# Patient Record
Sex: Male | Born: 1970 | Race: White | Hispanic: No | Marital: Married | Smoking: Light tobacco smoker
Health system: Southern US, Community
[De-identification: ages and names within clinical notes are randomized; demographics above are authoritative.]

## PROBLEM LIST (undated history)

## (undated) DIAGNOSIS — M1990 Unspecified osteoarthritis, unspecified site: Secondary | ICD-10-CM

## (undated) DIAGNOSIS — R202 Paresthesia of skin: Secondary | ICD-10-CM

## (undated) DIAGNOSIS — Z8489 Family history of other specified conditions: Secondary | ICD-10-CM

## (undated) DIAGNOSIS — R200 Anesthesia of skin: Secondary | ICD-10-CM

## (undated) HISTORY — PX: WISDOM TOOTH EXTRACTION: SHX21

---

## 2012-09-10 ENCOUNTER — Ambulatory Visit (HOSPITAL_COMMUNITY)
Admission: RE | Admit: 2012-09-10 | Discharge: 2012-09-10 | Disposition: A | Discharge status: Home / Self Care | Source: Ambulatory Visit | Attending: Internal Medicine | Admitting: Internal Medicine

## 2012-09-10 ENCOUNTER — Other Ambulatory Visit (HOSPITAL_COMMUNITY): Payer: Self-pay | Admitting: Internal Medicine

## 2012-09-10 DIAGNOSIS — M545 Low back pain, unspecified: Secondary | ICD-10-CM | POA: Insufficient documentation

## 2012-09-10 DIAGNOSIS — M5137 Other intervertebral disc degeneration, lumbosacral region: Secondary | ICD-10-CM | POA: Insufficient documentation

## 2012-09-10 DIAGNOSIS — M5410 Radiculopathy, site unspecified: Secondary | ICD-10-CM

## 2012-09-10 DIAGNOSIS — M542 Cervicalgia: Secondary | ICD-10-CM | POA: Insufficient documentation

## 2012-09-10 DIAGNOSIS — M51379 Other intervertebral disc degeneration, lumbosacral region without mention of lumbar back pain or lower extremity pain: Secondary | ICD-10-CM | POA: Insufficient documentation

## 2013-10-01 ENCOUNTER — Encounter (HOSPITAL_COMMUNITY): Payer: Self-pay | Admitting: Pharmacy Technician

## 2013-10-10 ENCOUNTER — Other Ambulatory Visit: Payer: Self-pay | Admitting: Orthopedic Surgery

## 2013-10-15 ENCOUNTER — Encounter (HOSPITAL_COMMUNITY)
Admission: RE | Admit: 2013-10-15 | Discharge: 2013-10-15 | Disposition: A | Discharge status: Home / Self Care | Source: Ambulatory Visit | Attending: Orthopedic Surgery | Admitting: Orthopedic Surgery

## 2013-10-15 ENCOUNTER — Encounter (HOSPITAL_COMMUNITY): Payer: Self-pay

## 2013-10-15 HISTORY — DX: Unspecified osteoarthritis, unspecified site: M19.90

## 2013-10-15 HISTORY — DX: Paresthesia of skin: R20.2

## 2013-10-15 HISTORY — DX: Anesthesia of skin: R20.0

## 2013-10-15 HISTORY — DX: Family history of other specified conditions: Z84.89

## 2013-10-15 LAB — COMPREHENSIVE METABOLIC PANEL
ALBUMIN: 4.3 g/dL (ref 3.5–5.2)
ALK PHOS: 48 U/L (ref 39–117)
ALT: 20 U/L (ref 0–53)
AST: 24 U/L (ref 0–37)
BILIRUBIN TOTAL: 0.6 mg/dL (ref 0.3–1.2)
BUN: 19 mg/dL (ref 6–23)
CO2: 28 mEq/L (ref 19–32)
Calcium: 9.4 mg/dL (ref 8.4–10.5)
Chloride: 98 mEq/L (ref 96–112)
Creatinine, Ser: 1.11 mg/dL (ref 0.50–1.35)
GFR calc non Af Amer: 80 mL/min — ABNORMAL LOW (ref >90)
Glucose, Bld: 84 mg/dL (ref 70–99)
POTASSIUM: 3.9 meq/L (ref 3.7–5.3)
Sodium: 138 mEq/L (ref 137–147)
TOTAL PROTEIN: 7.4 g/dL (ref 6.0–8.3)

## 2013-10-15 LAB — CBC WITH DIFFERENTIAL/PLATELET
BASOS PCT: 0 % (ref 0–1)
Basophils Absolute: 0 10*3/uL (ref 0.0–0.1)
EOS ABS: 0.1 10*3/uL (ref 0.0–0.7)
Eosinophils Relative: 2 % (ref 0–5)
HEMATOCRIT: 40 % (ref 39.0–52.0)
HEMOGLOBIN: 14.6 g/dL (ref 13.0–17.0)
Lymphocytes Relative: 28 % (ref 12–46)
Lymphs Abs: 2.3 10*3/uL (ref 0.7–4.0)
MCH: 31.5 pg (ref 26.0–34.0)
MCHC: 36.5 g/dL — AB (ref 30.0–36.0)
MCV: 86.4 fL (ref 78.0–100.0)
MONO ABS: 0.4 10*3/uL (ref 0.1–1.0)
MONOS PCT: 5 % (ref 3–12)
Neutro Abs: 5.4 10*3/uL (ref 1.7–7.7)
Neutrophils Relative %: 65 % (ref 43–77)
Platelets: 191 10*3/uL (ref 150–400)
RBC: 4.63 MIL/uL (ref 4.22–5.81)
RDW: 12.3 % (ref 11.5–15.5)
WBC: 8.2 10*3/uL (ref 4.0–10.5)

## 2013-10-15 LAB — PROTIME-INR
INR: 1.05 (ref 0.00–1.49)
PROTHROMBIN TIME: 13.5 s (ref 11.6–15.2)

## 2013-10-15 LAB — URINALYSIS, ROUTINE W REFLEX MICROSCOPIC
Bilirubin Urine: NEGATIVE
Glucose, UA: NEGATIVE mg/dL
HGB URINE DIPSTICK: NEGATIVE
KETONES UR: NEGATIVE mg/dL
Leukocytes, UA: NEGATIVE
Nitrite: NEGATIVE
PH: 5.5 (ref 5.0–8.0)
Protein, ur: NEGATIVE mg/dL
SPECIFIC GRAVITY, URINE: 1.028 (ref 1.005–1.030)
Urobilinogen, UA: 0.2 mg/dL (ref 0.0–1.0)

## 2013-10-15 LAB — ABO/RH: ABO/RH(D): A POS

## 2013-10-15 LAB — APTT: aPTT: 27 seconds (ref 24–37)

## 2013-10-15 LAB — SURGICAL PCR SCREEN
MRSA, PCR: NEGATIVE
Staphylococcus aureus: NEGATIVE

## 2013-10-15 LAB — TYPE AND SCREEN
ABO/RH(D): A POS
ANTIBODY SCREEN: NEGATIVE

## 2013-10-15 MED ORDER — CHLORHEXIDINE GLUCONATE 4 % EX LIQD
1.0000 "application " | Freq: Once | CUTANEOUS | Status: DC
  Filled 2013-10-15: qty 15

## 2013-10-15 MED ORDER — POVIDONE-IODINE 7.5 % EX SOLN
Freq: Once | CUTANEOUS | Status: DC
  Filled 2013-10-15: qty 118

## 2013-10-15 MED ORDER — CEFAZOLIN SODIUM-DEXTROSE 2-3 GM-% IV SOLR
2.0000 g | INTRAVENOUS | Status: AC
  Administered 2013-10-16: 2 g via INTRAVENOUS
  Filled 2013-10-15: qty 50

## 2013-10-15 NOTE — Pre-Procedure Instructions (Signed)
Zannie CoveKane Baldonado  10/15/2013   Your procedure is scheduled on:  Thursday, October 16, 2013 at 2:35 PM  Report to [facility] (use Main Entrance "A'') at 11:30 AM.  Call this number if you have problems the morning of surgery: [phone]   Remember:   Do not eat food or drink liquids after midnight.   Take these medicines the morning of surgery with A SIP OF WATER: pregabalin (LYRICA) 50 MG capsule,  If needed:HYDROcodone-acetaminophen (NORCO) 10-325 MG per tablet for pain Stop taking Aspirin, vitamins and herbal medications. Do not take any NSAIDs ie: Ibuprofen, Advil, Naproxen or any medication containing Aspirin.  Do not wear jewelry, make-up or nail polish.  Do not wear lotions, powders, or perfumes.  Do not shave 48 hours prior to surgery.  Do not bring valuables to the hospital.  [facility] is not responsible for any belongings or valuables.               Contacts, dentures or bridgework may not be worn into surgery.  Leave suitcase in the car. After surgery it may be brought to your room.  For patients admitted to the hospital, discharge time is determined by your treatment team.               Patients discharged the day of surgery will not be allowed to drive home.  Name and phone number of your driver:  Special Instructions:  Special Instructions:Special Instructions: [facility] - Preparing [facility]  Before surgery, you can play an important role.  Because skin is not sterile, your skin needs to be as free of germs as possible.  You can reduce the number of germs on you skin by washing with CHG (chlorahexidine gluconate) soap before surgery.  CHG is an antiseptic cleaner which kills germs and bonds with the skin to continue killing germs even after washing.  Please DO NOT use if you have an allergy to CHG or antibacterial soaps.  If your skin becomes reddened/irritated stop using the CHG and inform your nurse when you arrive at Short Stay.  Do not shave (including  legs and underarms) for at least 48 hours prior to the first CHG shower.  You may shave your face.  Please follow these instructions carefully:   1.  Shower with CHG Soap the night before surgery and the morning of Surgery.  2.  If you choose to wash your hair, wash your hair first as usual with your normal shampoo.  3.  After you shampoo, rinse your hair and body thoroughly to remove the Shampoo.  4.  Use CHG as you would any other liquid soap.  You can apply chg directly  to the skin and wash gently with scrungie or a clean washcloth.  5.  Apply the CHG Soap to your body ONLY FROM THE NECK DOWN.  Do not use on open wounds or open sores.  Avoid contact with your eyes, ears, mouth and genitals (private parts).  Wash genitals (private parts) with your normal soap.  6.  Wash thoroughly, paying special attention to the area where your surgery will be performed.  7.  Thoroughly rinse your body with warm water from the neck down.  8.  DO NOT shower/wash with your normal soap after using and rinsing off the CHG Soap.  9.  Pat yourself dry with a clean towel.            10.  Wear clean pajamas.  11.  Place clean sheets on your bed the night of your first shower and do not sleep with pets.  Day of Surgery  Do not apply any lotions/deodorants the morning of surgery.  Please wear clean clothes to the hospital/surgery center.   Please read over the following fact sheets that you were given: Pain Booklet, Coughing and Deep Breathing, Blood Transfusion Information, MRSA Information and Surgical Site Infection Prevention

## 2013-10-16 ENCOUNTER — Inpatient Hospital Stay (HOSPITAL_COMMUNITY): Admitting: Anesthesiology

## 2013-10-16 ENCOUNTER — Inpatient Hospital Stay (HOSPITAL_COMMUNITY)

## 2013-10-16 ENCOUNTER — Encounter (HOSPITAL_COMMUNITY): Admitting: Anesthesiology

## 2013-10-16 ENCOUNTER — Encounter (HOSPITAL_COMMUNITY): Payer: Self-pay | Admitting: *Deleted

## 2013-10-16 ENCOUNTER — Inpatient Hospital Stay (HOSPITAL_COMMUNITY)
Admission: RE | Admit: 2013-10-16 | Discharge: 2013-10-17 | DRG: 030 | Disposition: A | Discharge status: Home / Self Care | Source: Ambulatory Visit | Attending: Orthopedic Surgery | Admitting: Orthopedic Surgery

## 2013-10-16 ENCOUNTER — Encounter (HOSPITAL_COMMUNITY)
Admission: RE | Disposition: A | Discharge status: Home / Self Care | Payer: Self-pay | Source: Ambulatory Visit | Attending: Orthopedic Surgery

## 2013-10-16 DIAGNOSIS — F17200 Nicotine dependence, unspecified, uncomplicated: Secondary | ICD-10-CM | POA: Diagnosis present

## 2013-10-16 DIAGNOSIS — M129 Arthropathy, unspecified: Secondary | ICD-10-CM | POA: Diagnosis present

## 2013-10-16 DIAGNOSIS — Z01810 Encounter for preprocedural cardiovascular examination: Secondary | ICD-10-CM

## 2013-10-16 DIAGNOSIS — M4802 Spinal stenosis, cervical region: Secondary | ICD-10-CM | POA: Diagnosis present

## 2013-10-16 DIAGNOSIS — M5410 Radiculopathy, site unspecified: Secondary | ICD-10-CM | POA: Diagnosis present

## 2013-10-16 DIAGNOSIS — Z79899 Other long term (current) drug therapy: Secondary | ICD-10-CM

## 2013-10-16 DIAGNOSIS — Z01818 Encounter for other preprocedural examination: Secondary | ICD-10-CM

## 2013-10-16 DIAGNOSIS — M5412 Radiculopathy, cervical region: Principal | ICD-10-CM | POA: Diagnosis present

## 2013-10-16 DIAGNOSIS — Z01812 Encounter for preprocedural laboratory examination: Secondary | ICD-10-CM

## 2013-10-16 HISTORY — PX: ANTERIOR CERVICAL DECOMP/DISCECTOMY FUSION: SHX1161

## 2013-10-16 SURGERY — ANTERIOR CERVICAL DECOMPRESSION/DISCECTOMY FUSION 2 LEVELS
Anesthesia: General

## 2013-10-16 MED ORDER — CEFAZOLIN SODIUM 1-5 GM-% IV SOLN
1.0000 g | Freq: Three times a day (TID) | INTRAVENOUS | Status: AC
  Administered 2013-10-16 – 2013-10-17 (×2): 1 g via INTRAVENOUS
  Filled 2013-10-16 (×2): qty 50

## 2013-10-16 MED ORDER — PROPOFOL 10 MG/ML IV BOLUS
INTRAVENOUS | Status: DC | PRN
  Administered 2013-10-16: 200 mg via INTRAVENOUS

## 2013-10-16 MED ORDER — LACTATED RINGERS IV SOLN
INTRAVENOUS | Status: DC
  Administered 2013-10-16: 10:00:00 via INTRAVENOUS

## 2013-10-16 MED ORDER — EPHEDRINE SULFATE 50 MG/ML IJ SOLN
INTRAMUSCULAR | Status: AC
  Filled 2013-10-16: qty 1

## 2013-10-16 MED ORDER — BUPIVACAINE-EPINEPHRINE 0.25% -1:200000 IJ SOLN
INTRAMUSCULAR | Status: DC | PRN
Start: 2013-10-16 — End: 2013-10-16
  Administered 2013-10-16: 3 mL

## 2013-10-16 MED ORDER — SENNOSIDES-DOCUSATE SODIUM 8.6-50 MG PO TABS
1.0000 | ORAL_TABLET | Freq: Every evening | ORAL | Status: DC | PRN
  Filled 2013-10-16: qty 1

## 2013-10-16 MED ORDER — PANTOPRAZOLE SODIUM 40 MG IV SOLR
40.0000 mg | Freq: Every day | INTRAVENOUS | Status: DC
  Administered 2013-10-16: 40 mg via INTRAVENOUS
  Filled 2013-10-16 (×2): qty 40

## 2013-10-16 MED ORDER — DIAZEPAM 5 MG PO TABS
5.0000 mg | ORAL_TABLET | Freq: Four times a day (QID) | ORAL | Status: DC | PRN
Start: 2013-10-16 — End: 2013-10-17
  Administered 2013-10-16 – 2013-10-17 (×3): 5 mg via ORAL
  Filled 2013-10-16 (×3): qty 1

## 2013-10-16 MED ORDER — ONDANSETRON HCL 4 MG/2ML IJ SOLN
INTRAMUSCULAR | Status: AC
  Filled 2013-10-16: qty 2

## 2013-10-16 MED ORDER — GLYCOPYRROLATE 0.2 MG/ML IJ SOLN
INTRAMUSCULAR | Status: AC
  Filled 2013-10-16: qty 1

## 2013-10-16 MED ORDER — ROCURONIUM BROMIDE 100 MG/10ML IV SOLN
INTRAVENOUS | Status: DC | PRN
  Administered 2013-10-16: 50 mg via INTRAVENOUS
  Administered 2013-10-16: 10 mg via INTRAVENOUS
  Administered 2013-10-16: 20 mg via INTRAVENOUS

## 2013-10-16 MED ORDER — MIDAZOLAM HCL 5 MG/5ML IJ SOLN
INTRAMUSCULAR | Status: DC | PRN
  Administered 2013-10-16: 2 mg via INTRAVENOUS

## 2013-10-16 MED ORDER — ROCURONIUM BROMIDE 50 MG/5ML IV SOLN
INTRAVENOUS | Status: AC
  Filled 2013-10-16: qty 1

## 2013-10-16 MED ORDER — PROMETHAZINE HCL 25 MG/ML IJ SOLN: 6.2500 mg | INTRAMUSCULAR | Status: DC | PRN

## 2013-10-16 MED ORDER — BUPIVACAINE-EPINEPHRINE PF 0.25-1:200000 % IJ SOLN
INTRAMUSCULAR | Status: AC
  Filled 2013-10-16: qty 30

## 2013-10-16 MED ORDER — PROPOFOL 10 MG/ML IV BOLUS
INTRAVENOUS | Status: AC
  Filled 2013-10-16: qty 20

## 2013-10-16 MED ORDER — BISACODYL 5 MG PO TBEC
5.0000 mg | DELAYED_RELEASE_TABLET | Freq: Every day | ORAL | Status: DC | PRN
Start: 2013-10-16 — End: 2013-10-17
  Filled 2013-10-16: qty 1

## 2013-10-16 MED ORDER — MENTHOL 3 MG MT LOZG
1.0000 | LOZENGE | OROMUCOSAL | Status: DC | PRN
  Administered 2013-10-17: 3 mg via ORAL
  Filled 2013-10-16: qty 9

## 2013-10-16 MED ORDER — DOCUSATE SODIUM 100 MG PO CAPS
100.0000 mg | ORAL_CAPSULE | Freq: Two times a day (BID) | ORAL | Status: DC
  Administered 2013-10-16: 100 mg via ORAL
  Filled 2013-10-16 (×3): qty 1

## 2013-10-16 MED ORDER — MORPHINE SULFATE 2 MG/ML IJ SOLN
1.0000 mg | INTRAMUSCULAR | Status: DC | PRN
  Administered 2013-10-16 (×2): 4 mg via INTRAVENOUS
  Filled 2013-10-16 (×2): qty 2

## 2013-10-16 MED ORDER — ALUM & MAG HYDROXIDE-SIMETH 200-200-20 MG/5ML PO SUSP: 30.0000 mL | Freq: Four times a day (QID) | ORAL | Status: DC | PRN

## 2013-10-16 MED ORDER — GLYCOPYRROLATE 0.2 MG/ML IJ SOLN
INTRAMUSCULAR | Status: AC
  Filled 2013-10-16: qty 3

## 2013-10-16 MED ORDER — FLEET ENEMA 7-19 GM/118ML RE ENEM: 1.0000 | ENEMA | Freq: Once | RECTAL | Status: AC | PRN

## 2013-10-16 MED ORDER — ONDANSETRON HCL 4 MG/2ML IJ SOLN
INTRAMUSCULAR | Status: DC | PRN
  Administered 2013-10-16: 4 mg via INTRAVENOUS

## 2013-10-16 MED ORDER — OXYCODONE HCL 5 MG PO TABS
5.0000 mg | ORAL_TABLET | Freq: Once | ORAL | Status: AC | PRN
  Administered 2013-10-16: 5 mg via ORAL

## 2013-10-16 MED ORDER — ACETAMINOPHEN 650 MG RE SUPP: 650.0000 mg | RECTAL | Status: DC | PRN

## 2013-10-16 MED ORDER — STERILE WATER FOR INJECTION IJ SOLN
INTRAMUSCULAR | Status: AC
  Filled 2013-10-16: qty 10

## 2013-10-16 MED ORDER — LIDOCAINE HCL (CARDIAC) 20 MG/ML IV SOLN
INTRAVENOUS | Status: DC | PRN
  Administered 2013-10-16: 100 mg via INTRAVENOUS

## 2013-10-16 MED ORDER — SODIUM CHLORIDE 0.9 % IJ SOLN
3.0000 mL | Freq: Two times a day (BID) | INTRAMUSCULAR | Status: DC
  Administered 2013-10-16: 3 mL via INTRAVENOUS

## 2013-10-16 MED ORDER — FENTANYL CITRATE 0.05 MG/ML IJ SOLN
INTRAMUSCULAR | Status: AC
  Filled 2013-10-16: qty 5

## 2013-10-16 MED ORDER — HYDROMORPHONE HCL PF 1 MG/ML IJ SOLN
INTRAMUSCULAR | Status: AC
  Filled 2013-10-16: qty 2

## 2013-10-16 MED ORDER — SODIUM CHLORIDE 0.9 % IV SOLN: 250.0000 mL | INTRAVENOUS | Status: DC

## 2013-10-16 MED ORDER — PREGABALIN 50 MG PO CAPS
50.0000 mg | ORAL_CAPSULE | Freq: Two times a day (BID) | ORAL | Status: DC
  Administered 2013-10-16: 50 mg via ORAL
  Filled 2013-10-16: qty 1

## 2013-10-16 MED ORDER — ACETAMINOPHEN 325 MG PO TABS: 650.0000 mg | ORAL_TABLET | ORAL | Status: DC | PRN

## 2013-10-16 MED ORDER — OXYCODONE HCL 5 MG/5ML PO SOLN: 5.0000 mg | Freq: Once | ORAL | Status: AC | PRN

## 2013-10-16 MED ORDER — PHENOL 1.4 % MT LIQD
1.0000 | OROMUCOSAL | Status: DC | PRN
  Administered 2013-10-17: 1 via OROMUCOSAL
  Filled 2013-10-16: qty 177

## 2013-10-16 MED ORDER — FENTANYL CITRATE 0.05 MG/ML IJ SOLN
INTRAMUSCULAR | Status: DC | PRN
  Administered 2013-10-16: 100 ug via INTRAVENOUS
  Administered 2013-10-16 (×3): 50 ug via INTRAVENOUS

## 2013-10-16 MED ORDER — MIDAZOLAM HCL 2 MG/2ML IJ SOLN: 0.5000 mg | Freq: Once | INTRAMUSCULAR | Status: DC | PRN

## 2013-10-16 MED ORDER — THROMBIN 20000 UNITS EX KIT
PACK | CUTANEOUS | Status: AC
  Filled 2013-10-16: qty 1

## 2013-10-16 MED ORDER — THROMBIN 20000 UNITS EX SOLR
CUTANEOUS | Status: DC | PRN
  Administered 2013-10-16: 14:00:00 via TOPICAL

## 2013-10-16 MED ORDER — ARTIFICIAL TEARS OP OINT
TOPICAL_OINTMENT | OPHTHALMIC | Status: DC | PRN
  Administered 2013-10-16: 1 via OPHTHALMIC

## 2013-10-16 MED ORDER — NEOSTIGMINE METHYLSULFATE 1 MG/ML IJ SOLN
INTRAMUSCULAR | Status: DC | PRN
  Administered 2013-10-16: 4 mg via INTRAVENOUS

## 2013-10-16 MED ORDER — OXYCODONE HCL 5 MG PO TABS
ORAL_TABLET | ORAL | Status: AC
  Filled 2013-10-16: qty 1

## 2013-10-16 MED ORDER — MIDAZOLAM HCL 2 MG/2ML IJ SOLN
INTRAMUSCULAR | Status: AC
  Filled 2013-10-16: qty 2

## 2013-10-16 MED ORDER — SODIUM CHLORIDE 0.9 % IJ SOLN: 3.0000 mL | INTRAMUSCULAR | Status: DC | PRN

## 2013-10-16 MED ORDER — FENTANYL CITRATE 0.05 MG/ML IJ SOLN
INTRAMUSCULAR | Status: AC
Start: 2013-10-16 — End: 2013-10-16
  Filled 2013-10-16: qty 5

## 2013-10-16 MED ORDER — GLYCOPYRROLATE 0.2 MG/ML IJ SOLN
INTRAMUSCULAR | Status: DC | PRN
  Administered 2013-10-16: 0.6 mg via INTRAVENOUS
  Administered 2013-10-16: 0.2 mg via INTRAVENOUS

## 2013-10-16 MED ORDER — LACTATED RINGERS IV SOLN
INTRAVENOUS | Status: DC | PRN
  Administered 2013-10-16 (×3): via INTRAVENOUS

## 2013-10-16 MED ORDER — HYDROMORPHONE HCL PF 1 MG/ML IJ SOLN
0.2500 mg | INTRAMUSCULAR | Status: DC | PRN
  Administered 2013-10-16 (×4): 0.5 mg via INTRAVENOUS

## 2013-10-16 MED ORDER — THROMBIN 20000 UNITS EX SOLR
CUTANEOUS | Status: AC
  Filled 2013-10-16: qty 20000

## 2013-10-16 MED ORDER — MEPERIDINE HCL 25 MG/ML IJ SOLN: 6.2500 mg | INTRAMUSCULAR | Status: DC | PRN

## 2013-10-16 MED ORDER — EPHEDRINE SULFATE 50 MG/ML IJ SOLN
INTRAMUSCULAR | Status: DC | PRN
  Administered 2013-10-16: 10 mg via INTRAVENOUS
  Administered 2013-10-16: 15 mg via INTRAVENOUS

## 2013-10-16 MED ORDER — ONDANSETRON HCL 4 MG/2ML IJ SOLN: 4.0000 mg | INTRAMUSCULAR | Status: DC | PRN

## 2013-10-16 MED ORDER — OXYCODONE-ACETAMINOPHEN 5-325 MG PO TABS
1.0000 | ORAL_TABLET | ORAL | Status: DC | PRN
  Administered 2013-10-16 – 2013-10-17 (×3): 2 via ORAL
  Filled 2013-10-16 (×3): qty 2

## 2013-10-16 MED ORDER — ALBUMIN HUMAN 5 % IV SOLN
INTRAVENOUS | Status: DC | PRN
  Administered 2013-10-16: 14:00:00 via INTRAVENOUS

## 2013-10-16 SURGICAL SUPPLY — 70 items
BENZOIN TINCTURE PRP APPL 2/3 (GAUZE/BANDAGES/DRESSINGS) ×2 IMPLANT
BIT DRILL NEURO 2X3.1 SFT TUCH (MISCELLANEOUS) ×1 IMPLANT
BIT DRILL SKYLINE 12MM (BIT) ×1 IMPLANT
BLADE SURG 15 STRL LF DISP TIS (BLADE) ×1 IMPLANT
BLADE SURG 15 STRL SS (BLADE) ×1
BLADE SURG ROTATE 9660 (MISCELLANEOUS) ×2 IMPLANT
BUR MATCHSTICK NEURO 3.0 LAGG (BURR) ×2 IMPLANT
CARTRIDGE OIL MAESTRO DRILL (MISCELLANEOUS) ×1 IMPLANT
CERVICAL PARALLEL MED 7MM (Bone Implant) ×4 IMPLANT
CLSR STERI-STRIP ANTIMIC 1/2X4 (GAUZE/BANDAGES/DRESSINGS) ×2 IMPLANT
COLLAR CERV LO CONTOUR FIRM DE (SOFTGOODS) IMPLANT
CORDS BIPOLAR (ELECTRODE) ×2 IMPLANT
COVER SURGICAL LIGHT HANDLE (MISCELLANEOUS) ×2 IMPLANT
CRADLE DONUT ADULT HEAD (MISCELLANEOUS) ×2 IMPLANT
DIFFUSER DRILL AIR PNEUMATIC (MISCELLANEOUS) ×2 IMPLANT
DRAIN JACKSON RD 7FR 3/32 (WOUND CARE) IMPLANT
DRAPE C-ARM 42X72 X-RAY (DRAPES) ×2 IMPLANT
DRAPE POUCH INSTRU U-SHP 10X18 (DRAPES) ×2 IMPLANT
DRAPE SURG 17X23 STRL (DRAPES) ×6 IMPLANT
DRILL BIT SKYLINE 12MM (BIT) ×1
DRILL NEURO 2X3.1 SOFT TOUCH (MISCELLANEOUS) ×2
DURAPREP 26ML APPLICATOR (WOUND CARE) ×2 IMPLANT
ELECT COATED BLADE 2.86 ST (ELECTRODE) ×2 IMPLANT
ELECT REM PT RETURN 9FT ADLT (ELECTROSURGICAL) ×2
ELECTRODE REM PT RTRN 9FT ADLT (ELECTROSURGICAL) ×1 IMPLANT
EVACUATOR SILICONE 100CC (DRAIN) IMPLANT
GAUZE SPONGE 4X4 16PLY XRAY LF (GAUZE/BANDAGES/DRESSINGS) ×2 IMPLANT
GLOVE BIO SURGEON STRL SZ7 (GLOVE) ×2 IMPLANT
GLOVE BIO SURGEON STRL SZ8 (GLOVE) ×2 IMPLANT
GLOVE BIOGEL PI IND STRL 7.5 (GLOVE) ×2 IMPLANT
GLOVE BIOGEL PI IND STRL 8 (GLOVE) ×1 IMPLANT
GLOVE BIOGEL PI INDICATOR 7.5 (GLOVE) ×2
GLOVE BIOGEL PI INDICATOR 8 (GLOVE) ×1
GOWN STRL NON-REIN LRG LVL3 (GOWN DISPOSABLE) ×2 IMPLANT
GOWN STRL REIN XL XLG (GOWN DISPOSABLE) ×2 IMPLANT
IV CATH 14GX2 1/4 (CATHETERS) ×2 IMPLANT
KIT BASIN OR (CUSTOM PROCEDURE TRAY) ×2 IMPLANT
KIT ROOM TURNOVER OR (KITS) ×2 IMPLANT
MANIFOLD NEPTUNE II (INSTRUMENTS) ×2 IMPLANT
NEEDLE 27GAX1X1/2 (NEEDLE) ×2 IMPLANT
NEEDLE SPNL 20GX3.5 QUINCKE YW (NEEDLE) ×2 IMPLANT
NS IRRIG 1000ML POUR BTL (IV SOLUTION) ×2 IMPLANT
OIL CARTRIDGE MAESTRO DRILL (MISCELLANEOUS) ×2
PACK ORTHO CERVICAL (CUSTOM PROCEDURE TRAY) ×2 IMPLANT
PAD ARMBOARD 7.5X6 YLW CONV (MISCELLANEOUS) ×4 IMPLANT
PATTIES SURGICAL .5 X.5 (GAUZE/BANDAGES/DRESSINGS) IMPLANT
PATTIES SURGICAL .5 X1 (DISPOSABLE) IMPLANT
PIN DISTRACTION 14 (PIN) ×4 IMPLANT
PLATE SKYLINE 2 LEVEL 34MM (Plate) ×2 IMPLANT
PUTTY BONE DBX 2.5 MIS (Bone Implant) ×2 IMPLANT
SCREW VAR SELF TAP SKYLINE 14M (Screw) ×12 IMPLANT
SPONGE GAUZE 4X4 12PLY (GAUZE/BANDAGES/DRESSINGS) ×2 IMPLANT
SPONGE GAUZE 4X4 12PLY STER LF (GAUZE/BANDAGES/DRESSINGS) ×2 IMPLANT
SPONGE INTESTINAL PEANUT (DISPOSABLE) ×2 IMPLANT
SPONGE SURGIFOAM ABS GEL 100 (HEMOSTASIS) ×2 IMPLANT
STRIP CLOSURE SKIN 1/2X4 (GAUZE/BANDAGES/DRESSINGS) ×2 IMPLANT
SURGIFLO TRUKIT (HEMOSTASIS) IMPLANT
SUT MNCRL AB 4-0 PS2 18 (SUTURE) ×2 IMPLANT
SUT SILK 4 0 (SUTURE)
SUT SILK 4-0 18XBRD TIE 12 (SUTURE) IMPLANT
SUT VIC AB 2-0 CT2 18 VCP726D (SUTURE) ×2 IMPLANT
SYR BULB IRRIGATION 50ML (SYRINGE) ×2 IMPLANT
SYR CONTROL 10ML LL (SYRINGE) ×4 IMPLANT
TAPE CLOTH 4X10 WHT NS (GAUZE/BANDAGES/DRESSINGS) ×2 IMPLANT
TAPE CLOTH SURG 4X10 WHT LF (GAUZE/BANDAGES/DRESSINGS) ×2 IMPLANT
TAPE UMBILICAL COTTON 1/8X30 (MISCELLANEOUS) ×2 IMPLANT
TOWEL OR 17X24 6PK STRL BLUE (TOWEL DISPOSABLE) ×2 IMPLANT
TOWEL OR 17X26 10 PK STRL BLUE (TOWEL DISPOSABLE) ×2 IMPLANT
WATER STERILE IRR 1000ML POUR (IV SOLUTION) ×2 IMPLANT
YANKAUER SUCT BULB TIP NO VENT (SUCTIONS) ×2 IMPLANT

## 2013-10-16 NOTE — Progress Notes (Signed)
Orthopedic Tech Progress Note Patient Details:  Jeffery Marshall 1971/04/02 [id number]  Ortho Devices Type of Ortho Device: [place] cervical collar Ortho Device/Splint Interventions: Jeffery DoffingOrdered   Jeffery Marshall 10/16/2013, 5:50 PM

## 2013-10-16 NOTE — Anesthesia Postprocedure Evaluation (Signed)
Anesthesia Post Note  Patient: Jeffery Marshall  Procedure(s) Performed: Procedure(s) (LRB): ANTERIOR CERVICAL DECOMPRESSION/DISCECTOMY FUSION 2 LEVELS (N/A)  Anesthesia type: General  Patient location: PACU  Post pain: Pain level controlled and Adequate analgesia  Post assessment: Post-op Vital signs reviewed, Patient's Cardiovascular Status Stable, Respiratory Function Stable, Patent Airway and Pain level controlled  Last Vitals:  Filed Vitals:   10/16/13 1630  BP:   Pulse: 97  Temp:   Resp: 14    Post vital signs: Reviewed and stable  Level of consciousness: awake, alert  and oriented  Complications: No apparent anesthesia complications

## 2013-10-16 NOTE — Transfer of Care (Signed)
Immediate Anesthesia Transfer of Care Note  Patient: Jeffery Marshall  Procedure(s) Performed: Procedure(s) with comments: ANTERIOR CERVICAL DECOMPRESSION/DISCECTOMY FUSION 2 LEVELS (N/A) - Anterior cervical decompression fusion, cervical 5-6, cervical 6-7 with instrumentation and allograft  Patient Location: PACU  Anesthesia Type:General  Level of Consciousness: awake, alert  and oriented  Airway & Oxygen Therapy: Patient Spontanous Breathing and Patient connected to nasal cannula oxygen  Post-op Assessment: Report given to PACU RN and Post -op Vital signs reviewed and stable  Post vital signs: Reviewed and stable  Complications: No apparent anesthesia complications

## 2013-10-16 NOTE — H&P (Signed)
PREOPERATIVE H&P  Chief Complaint: left arm pain  HPI: Jeffery Marshall is a 43 y.o. male who presents with ongoing pain in the left arm. 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 EMG was also diagnostic for radiculopathy. Patient failed multiple forms of conservative care and did wish to proceed with surgical intervention.  Past Medical History  Diagnosis Date  . Family history of anesthesia complication     Hx; of uncle with problem waking up in 1950''s or 60's  . Numbness and tingling in left arm     Hx: of   . Arthritis    Past Surgical History  Procedure Laterality Date  . Wisdom tooth extraction      Hx; of   History   Social History  . Marital Status: Married    Spouse Name: N/A    Number of Children: N/A  . Years of Education: N/A   Social History Main Topics  . Smoking status: Light Tobacco Smoker    Types: Cigars  . Smokeless tobacco: Former NeurosurgeonUser    Types: Snuff  . Alcohol Use: Yes     Comment: occasional   . Drug Use: No  . Sexual Activity: Not on file   Other Topics Concern  . Not on file   Social History Narrative  . No narrative on file   Family History  Problem Relation Age of Onset  . Diabetes Father   . Hypertension Father   . Cancer Sister    No Known Allergies Prior to Admission medications   Medication Sig Start Date End Date Taking? Authorizing Provider  HYDROcodone-acetaminophen (NORCO) 10-325 MG per tablet Take 1 tablet by mouth every 6 (six) hours as needed for moderate pain.   Yes Historical Provider, MD  pregabalin (LYRICA) 50 MG capsule Take 50 mg by mouth 2 (two) times daily.   Yes Historical Provider, MD     All other systems have been reviewed and were otherwise negative with the exception of those mentioned in the HPI and as above.  Physical Exam: There were no vitals filed for this visit.  General: Alert, no acute distress Cardiovascular: No pedal edema Respiratory: No cyanosis, no use of  accessory musculature Skin: No lesions in the area of chief complaint Neurologic: Sensation intact distally Psychiatric: Patient is competent for consent with normal mood and affect Lymphatic: No axillary or cervical lymphadenopathy  MUSCULOSKELETAL: + spurling's sign on left  Assessment/Plan: Left arm pain Plan for Procedure(s): ANTERIOR CERVICAL DECOMPRESSION/DISCECTOMY FUSION 2 LEVELS   Emilee HeroUMONSKI,Roselynne Lortz LEONARD, MD 10/16/2013 7:18 AM

## 2013-10-16 NOTE — Anesthesia Procedure Notes (Signed)
Procedure Name: Intubation Date/Time: 10/16/2013 1:01 PM Performed by: Fransisca KaufmannMEYER, Gurfateh Mcclain E Pre-anesthesia Checklist: Patient identified, Emergency Drugs available, Suction available, Patient being monitored and Timeout performed Patient Re-evaluated:Patient Re-evaluated prior to inductionOxygen Delivery Method: Circle system utilized Preoxygenation: Pre-oxygenation with 100% oxygen Intubation Type: IV induction Ventilation: Mask ventilation without difficulty Laryngoscope Size: Miller and 3 Grade View: Grade I Tube type: Oral Tube size: 7.5 mm Number of attempts: 1 Airway Equipment and Method: Stylet Placement Confirmation: ETT inserted through vocal cords under direct vision,  positive ETCO2 and breath sounds checked- equal and bilateral Secured at: 23 cm Tube secured with: Tape Dental Injury: Teeth and Oropharynx as per pre-operative assessment

## 2013-10-16 NOTE — Preoperative (Signed)
Beta Blockers   Reason not to administer Beta Blockers:Not Applicable 

## 2013-10-16 NOTE — Anesthesia Preprocedure Evaluation (Addendum)
Anesthesia Evaluation  Patient identified by MRN, date of birth, ID band Patient awake    Reviewed: Allergy & Precautions, H&P , NPO status , Patient's Chart, lab work & pertinent test results  History of Anesthesia Complications Negative for: history of anesthetic complications  Airway Mallampati: II  Neck ROM: Full    Dental  (+) Dental Advisory Given, Chipped   Pulmonary Current Smoker,  breath sounds clear to auscultation  Pulmonary exam normal       Cardiovascular negative cardio ROS  Rhythm:Regular Rate:Normal     Neuro/Psych Neck pain    GI/Hepatic negative GI ROS, Neg liver ROS,   Endo/Other  negative endocrine ROS  Renal/GU negative Renal ROS     Musculoskeletal   Abdominal   Peds  Hematology negative hematology ROS (+)   Anesthesia Other Findings   Reproductive/Obstetrics                          Anesthesia Physical Anesthesia Plan  ASA: I  Anesthesia Plan: General   Post-op Pain Management:    Induction: Intravenous  Airway Management Planned: Oral ETT  Additional Equipment:   Intra-op Plan:   Post-operative Plan: Extubation in OR  Informed Consent: I have reviewed the patients History and Physical, chart, labs and discussed the procedure including the risks, benefits and alternatives for the proposed anesthesia with the patient or authorized representative who has indicated his/her understanding and acceptance.   Dental advisory given  Plan Discussed with: CRNA and Surgeon  Anesthesia Plan Comments: (Plan routine monitors, GETA)        Anesthesia Quick Evaluation

## 2013-10-17 ENCOUNTER — Encounter (HOSPITAL_COMMUNITY): Payer: Self-pay | Admitting: Orthopedic Surgery

## 2013-10-17 NOTE — Progress Notes (Signed)
Pt. discharged home accompanied by spouse. Prescriptions and discharge instructions given with verbalization of understanding. Incision site on neck with no s/s of infection - no swelling, redness, bleeding, and/or drainage noted. Soft collar intact. Pain med given just before leaving. Opportunity given to ask questions but no question asked. Pt. transported out of this unit in wheelchair by the nurse tech. 

## 2013-10-17 NOTE — Plan of Care (Signed)
Problem: Consults Goal: Diagnosis - Spinal Surgery Outcome: Completed/Met Date Met:  10/17/13 Cervical Spine Fusion

## 2013-10-17 NOTE — Progress Notes (Signed)
Utilization review completed.  

## 2013-10-17 NOTE — Progress Notes (Signed)
Patient reports resolution of left arm pain. Minimal neck discomfort. No swallowing difficulties.  BP 109/59  Pulse 67  Temp(Src) 98 F (36.7 C) (Oral)  Resp 16  SpO2 97%  NVI Dressing in place Collar well applied  S/p 5-7 acdf  - philly collar for showering - no lifting > 10 pounds - f/u 2 weeks

## 2013-10-17 NOTE — Op Note (Signed)
NAMEHAMMOND, OBEIRNE NO.:  [id number]  MEDICAL RECORD NO.:  [id number]  LOCATION:  3C06C                        FACILITY:  [facility]  PHYSICIAN:  Estill Bamberg, MD      DATE OF BIRTH:  Dec 16, 1970  DATE OF PROCEDURE:  10/16/2013                              OPERATIVE REPORT   PREOPERATIVE DIAGNOSES: 1. Left-sided C6 radiculopathy. 2. Left-sided C7 radiculopathy. 3. 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  POSTOPERATIVE DIAGNOSES: 1. Left-sided C6 radiculopathy. 2. Left-sided C7 radiculopathy. 3. 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  PROCEDURES: 1. Anterior cervical decompression and fusion, C5-6, C6-7. 2. Placement of anterior instrumentation, C5-C7. 3. Insertion of interbody device x2 (7-mm parallel medium Titan     interbody spacers). 4. Use of morselized allograft. 5. Intraoperative use of fluoroscopy.  SURGEON:  Estill Bamberg, MD  ASSISTANT:  Jason Coop, PA-C  ANESTHESIA:  General endotracheal anesthesia.  COMPLICATIONS:  None.  DISPOSITION:  Stable.  ESTIMATED BLOOD LOSS:  Minimal.  INDICATIONS FOR PROCEDURE:  Briefly, Mr. Memmott is a very pleasant 43- year-old male, who did initially present to me on August 27, 2013, with significant pain in the left arm.  The patient did have an EMG which was diagnostic for left C6 and C7 radiculopathy.  His MRI did also reveal neural foraminal narrowing at the C3-4 level.  I therefore did arrange him having a left C4 nerve block.  He states that the nerve block did not help his symptoms at all.  I therefore did feel confident and explained to the patient that I did feel his left arm pain was secondary to C6 and C7 radiculopathy.  He did fail multiple forms of conservative care, did continue to have pain.  We therefore did discuss proceeding with an anterior cervical decompression and fusion at the C5- 6 and C6-7 levels.  The patient did fully understand the risks  and limitations of the procedure as outlined in my preoperative note.  OPERATIVE DETAILS:  On October 16, 2013, the patient was brought to surgery and general endotracheal anesthesia was administered.  The patient was placed supine on a hospital bed.  Antibiotics were given. The neck was prepped and draped in usual sterile fashion.  All bony prominences were padded.  Time-out procedure was performed.  I then made a left-sided transverse incision overlying the C6 vertebral body.  The platysma was sharply incised.  The plane between the sternocleidomastoid muscle and the strap muscles was identified and explored.  The anterior cervical spine was readily noted.  I then obtained a lateral intraoperative fluoroscopic view to confirm the appropriate operative levels.  I then subperiosteally exposed the vertebral bodies of C5, C6, and C7.  A Shadow-Line retractor was placed and centered over the C6-7 intervertebral space.  Caspar pins were placed into the C6 and C7 vertebral bodies and distraction was applied.  I then went forward with a diskectomy using a 15-blade knife followed by a series of curettes and pituitary rongeurs and Kerrison punches.  The posterior longitudinal ligament was identified and entered using a nerve hook.  I then used a #1 followed by #2 Kerrison to perform a thorough central  and bilateral neural foraminal decompression.  The endplates were then prepared.  I then placed a series of trials and I did feel the appropriate size interbody spacer with DBX mix which was tamped into position in the usual fashion under distraction.  A Caspar pin from the C7 vertebral body was removed and the bone wax was placed in its place.  I then placed a Caspar pin into the C5 vertebral body.  Distraction was applied across the C5-6 interspace.  A diskectomy was performed in the manner previously described.  Once again, I did confirm a bilateral neural foraminal decompression.  The  endplates were again prepared.  I again placed a series of trials and I did select the appropriate size interbody spacer which was packed with DBX mix and tamped into position in the usual fashion.  The Caspar pins were removed.  The bone wax was placed in their place.  I then selected the appropriate sized anterior cervical plate, which was placed over the anterior cervical spine.  14- mm variable angle screws were placed, two in each vertebral body at C5, C6, and C7 for a total of six vertebral body screws.  I was very pleased with the press fit of the screws.  A Cam locking mechanism was utilized. The wound was then copiously irrigated.  I was very pleased with the AP and lateral fluoroscopic images.  The platysma was then closed using 2-0 Vicryl.  The skin was then closed using 4-0 Monocryl.  Benzoin and Steri- Strips were applied followed by sterile dressing.  All instrument counts were correct at the termination of the procedure.  I did explore the wound for any undue bleeding prior to closure, and none was encountered.  Of note, Jason CoopKayla McKenzie, was my assistant throughout the entirety of the procedure, and did aid in essential retraction and suctioning needed throughout the surgery.     Estill BambergMark Ulysees Robarts, MD     MD/MEDQ  D:  10/16/2013  T:  10/17/2013  Job:  [id number]  cc:   Kingsley Callanderoy O. Ouida SillsFagan, MD Dr. Romero BellingWesley Ibazebo

## 2013-10-29 NOTE — Discharge Summary (Signed)
Patient ID: Jeffery Marshall MRN: [id number] DOB/AGE: 11-13-1970 43 y.o.  Admit date: 10/16/2013 Discharge date: 10/17/2013  Admission Diagnoses:  Active Problems:   Radiculopathy   Discharge Diagnoses:  Same  Past Medical History  Diagnosis Date  . Family history of anesthesia complication     Hx; of uncle with problem waking up in 1950''s or 60's  . Numbness and tingling in left arm     Hx: of   . Arthritis     Surgeries: Procedure(s): ANTERIOR CERVICAL DECOMPRESSION/DISCECTOMY FUSION 2 LEVELS C5-7 on 10/16/2013   Discharged Condition: Improved  Hospital Course: Jeffery Marshall is an 43 y.o male who was admitted 10/16/2013 for operative treatment of left radiculopathy. Patient has severe unremitting pain that affects sleep, daily activities, and work/hobbies. After pre-op clearance the patient was taken to the operating room on 10/16/2013 and underwent  Procedure(s): ANTERIOR CERVICAL DECOMPRESSION/DISCECTOMY FUSION 2 LEVELS C5-7.    Patient was given perioperative antibiotics:  Anti-infectives   Start     Dose/Rate Route Frequency Ordered Stop   10/16/13 2000  ceFAZolin (ANCEF) IVPB 1 g/50 mL premix     1 g 100 mL/hr over 30 Minutes Intravenous Every 8 hours 10/16/13 1742 10/17/13 0359   10/16/13 0600  ceFAZolin (ANCEF) IVPB 2 g/50 mL premix     2 g 100 mL/hr over 30 Minutes Intravenous On call to O.R. 10/15/13 1437 10/16/13 1305       Patient was given sequential compression devices, early ambulation to prevent DVT.  Patient benefited maximally from hospital stay and there were no complications.    Recent vital signs: BP 118/82  Pulse 80  Temp(Src) 98.7 F (37.1 C) (Oral)  Resp 18  SpO2 100%   Discharge Medications:     Medication List    STOP taking these medications       HYDROcodone-acetaminophen 10-325 MG per tablet  Commonly known as:  NORCO      TAKE these medications       pregabalin 50 MG capsule  Commonly known as:  LYRICA  Take 50 mg by mouth 2  (two) times daily.        Diagnostic Studies: Dg Chest 2 View  10/15/2013   CLINICAL DATA:  Preop cervical spine surgery  EXAM: CHEST  2 VIEW  COMPARISON:  None.  FINDINGS: The heart size and mediastinal contours are within normal limits. Both lungs are clear. The visualized skeletal structures are unremarkable.  IMPRESSION: No active cardiopulmonary disease.   Electronically Signed   By: Elige Ko   On: 10/15/2013 16:34   Dg Cervical Spine 2 Or 3 Views  10/16/2013   CLINICAL DATA:  Neck pain.  EXAM: CERVICAL SPINE - 2-3 VIEW; DG C-ARM 61-120 MIN  COMPARISON:  None.  FINDINGS: C-arm radiographs document C5-C7 ACDF with anterior plating and interbody cage placement. Satisfactory position and alignment. On the final film there is sponge anterior to the C6 vertebral body.  IMPRESSION: C5-7 fusion.   Electronically Signed   By: Davonna Belling M.D.   On: 10/16/2013 16:16   Dg C-arm 61-120 Min  10/16/2013   CLINICAL DATA:  Neck pain.  EXAM: CERVICAL SPINE - 2-3 VIEW; DG C-ARM 61-120 MIN  COMPARISON:  None.  FINDINGS: C-arm radiographs document C5-C7 ACDF with anterior plating and interbody cage placement. Satisfactory position and alignment. On the final film there is sponge anterior to the C6 vertebral body.  IMPRESSION: C5-7 fusion.   Electronically Signed   By: Unice Bailey.D.  On: 10/16/2013 16:16    Disposition: 01-Home or Self Care      Discharge Orders   Future Orders Complete By Expires   Discharge patient  As directed      S/p 5-7 acdf  - philly collar for showering  - no lifting > 10 pounds  - f/u 2 weeks  Signed: Georga BoraMCKENZIE, Imanuel Pruiett J 10/29/2013, 11:19 AM

## 2014-08-06 IMAGING — CR DG CHEST 2V
2 series · 2 of 2 positions shown · non-contrast
Comparison: None.

CLINICAL DATA: Preop cervical spine surgery

EXAM:
CHEST  2 VIEW

[w chest pa]
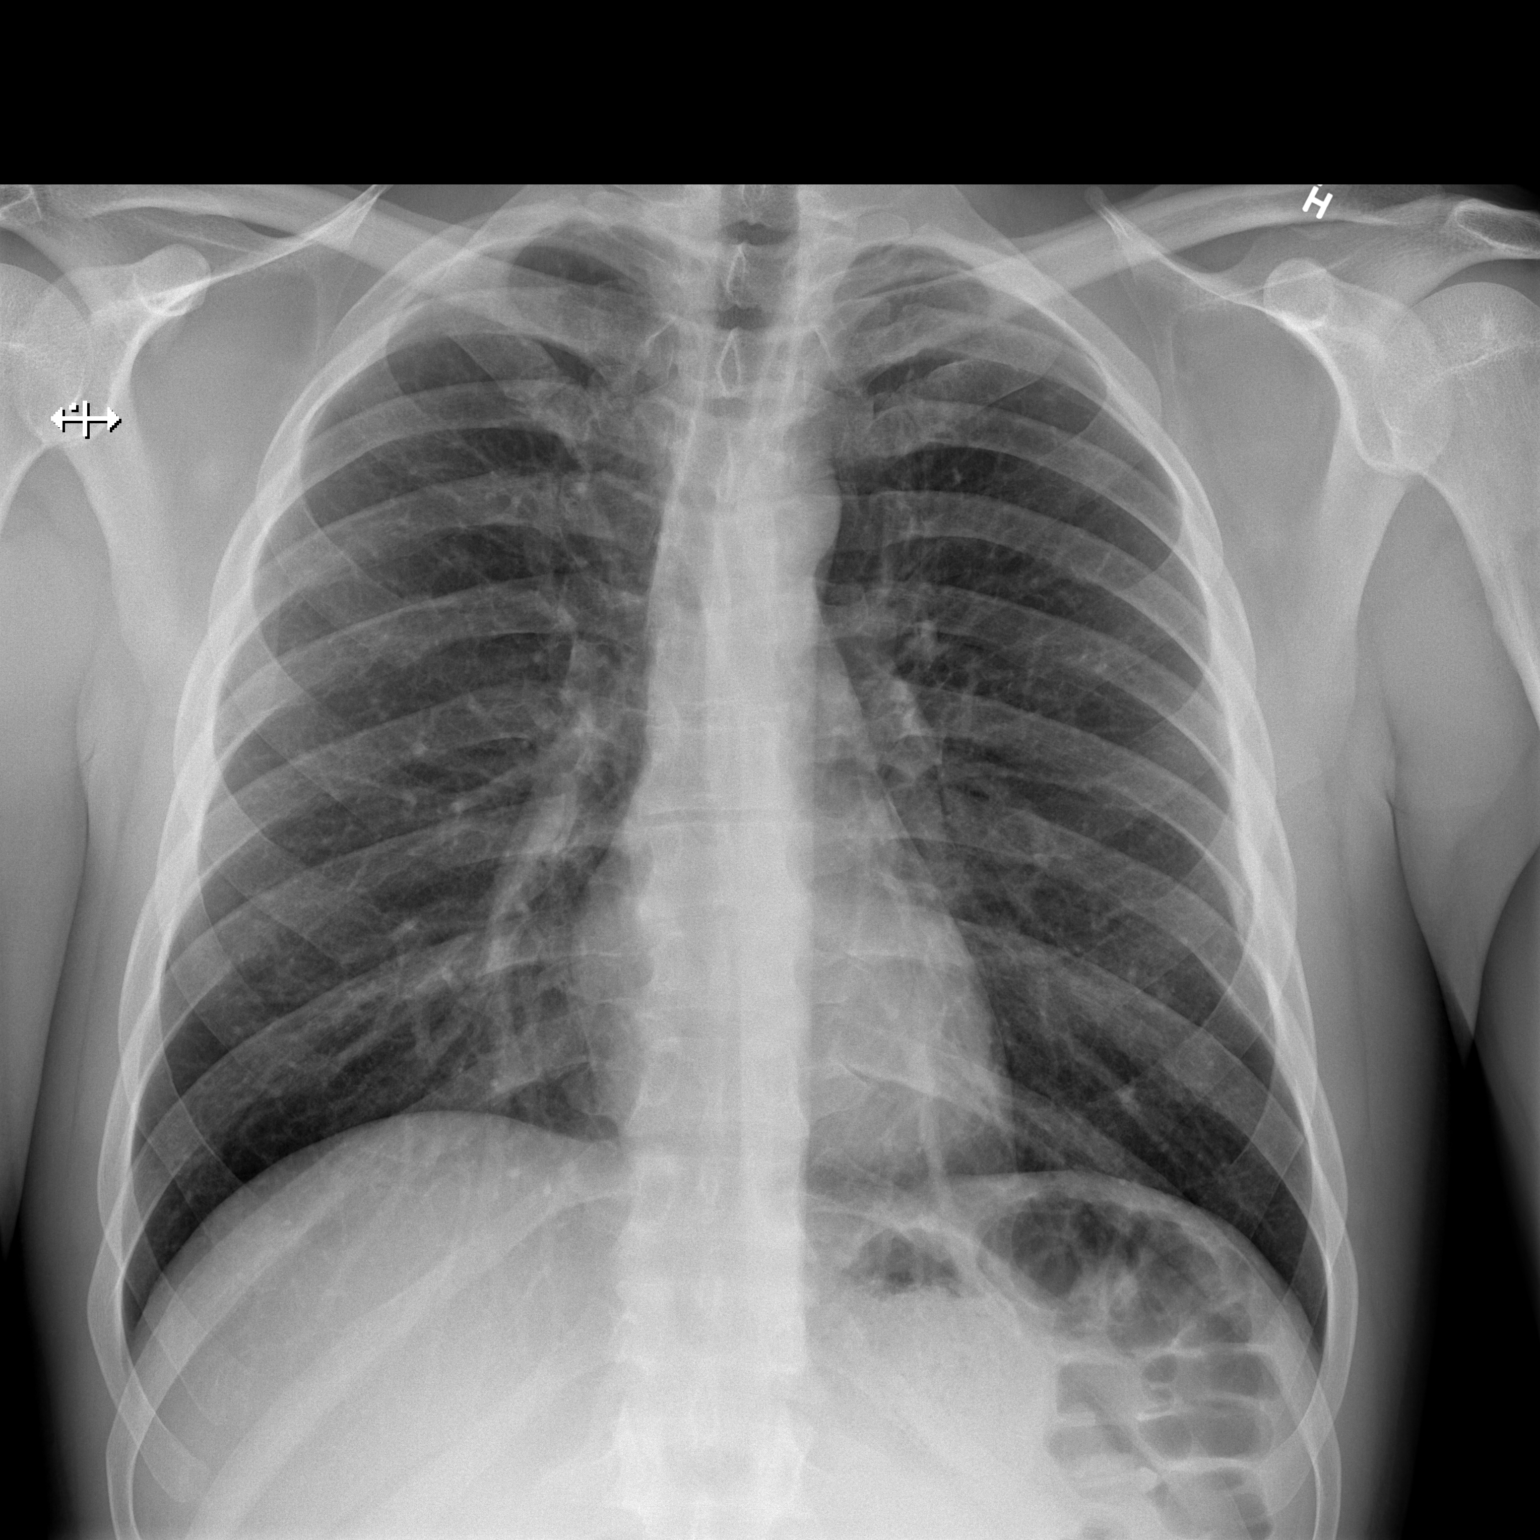

[w chest lat]
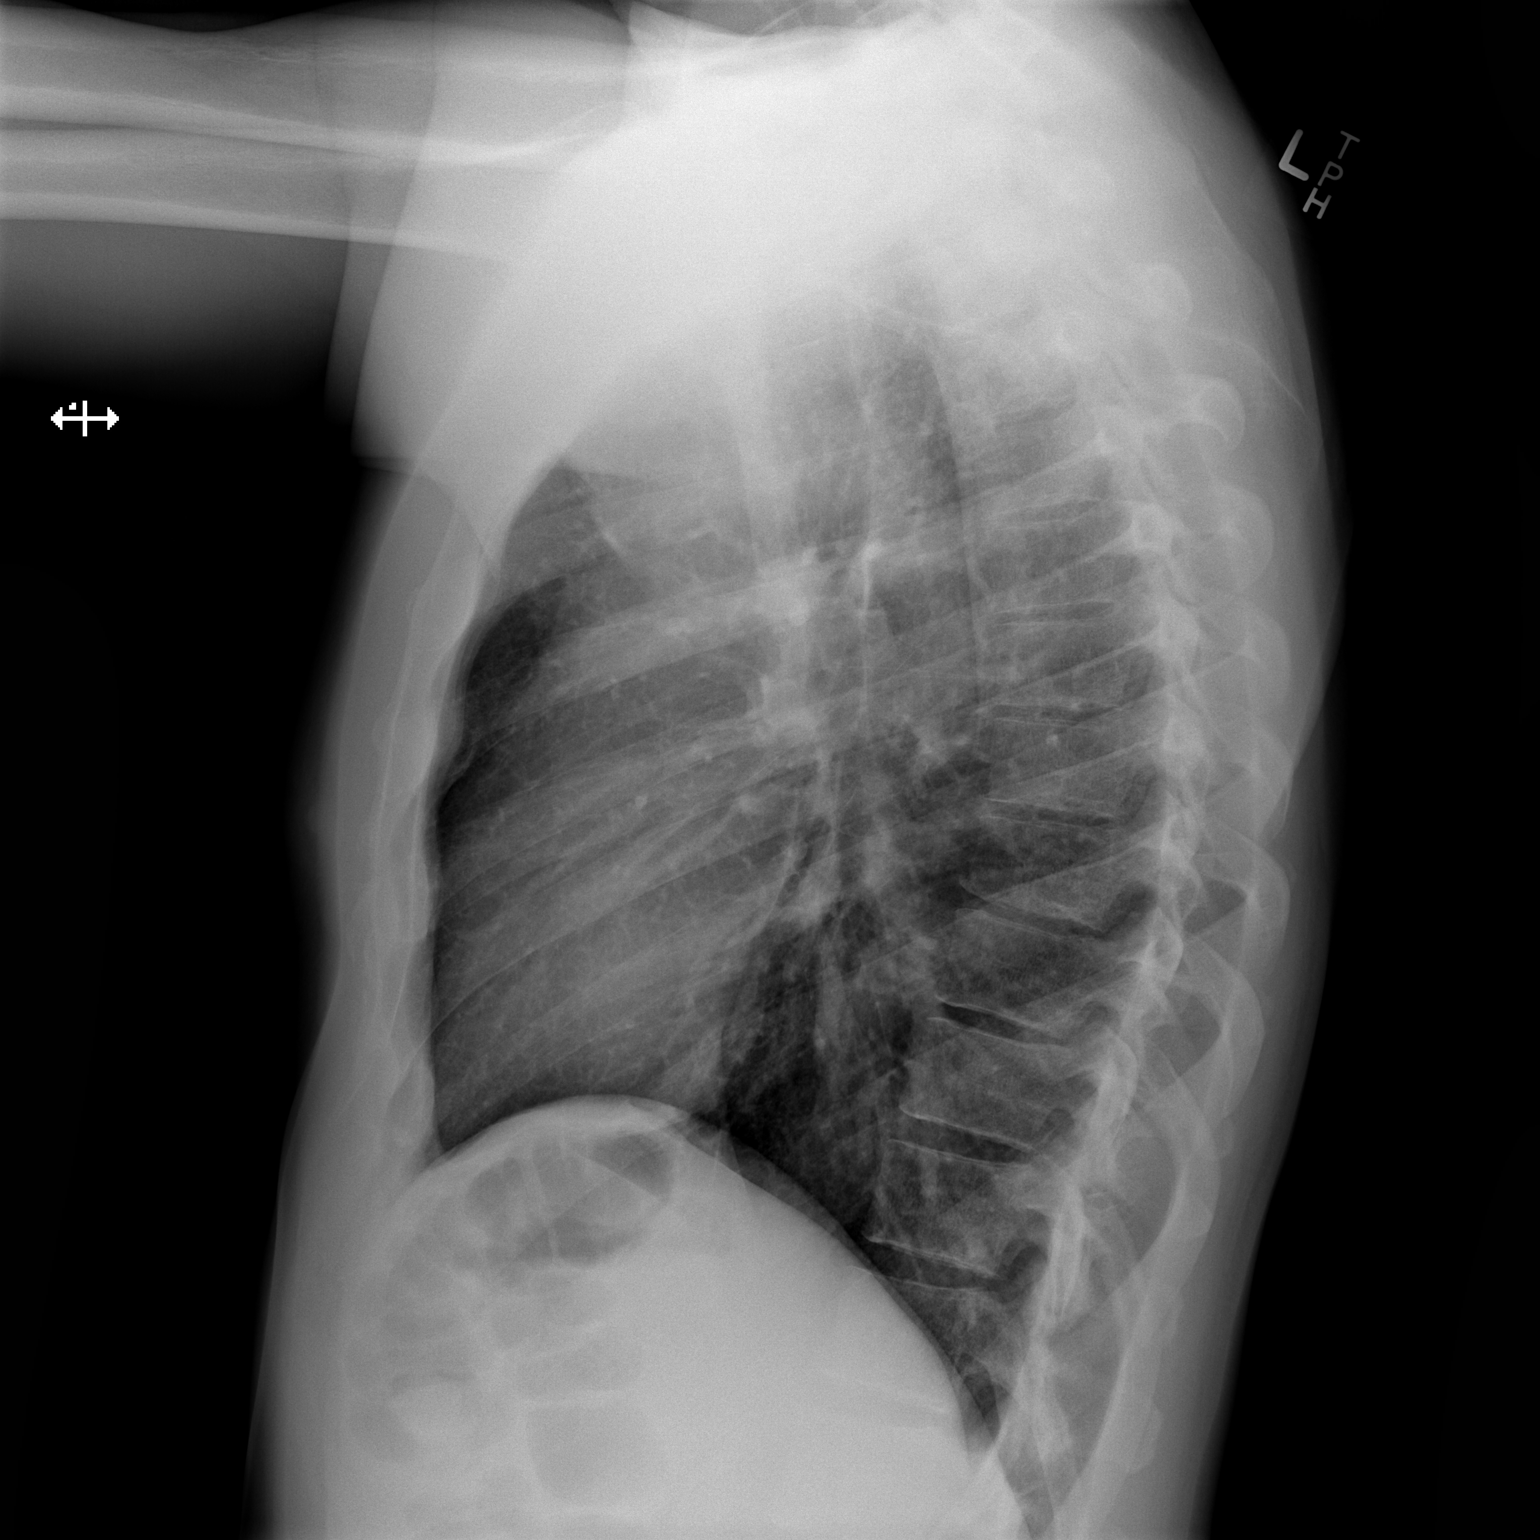

[2 of 2 positions shown; findings below may reference images not displayed]

FINDINGS: The heart size and mediastinal contours are within normal limits.
Both lungs are clear. The visualized skeletal structures are
unremarkable.
IMPRESSION: No active cardiopulmonary disease.

## 2014-08-07 IMAGING — RF DG C-ARM 61-120 MIN
1 series · 4 of 4 positions shown · non-contrast
Comparison: None.

CLINICAL DATA: Neck pain.

EXAM:
CERVICAL SPINE - 2-3 VIEW; DG C-ARM 61-120 MIN

[Series 1: run · 4 of 4 slices shown]
[im 1/4]
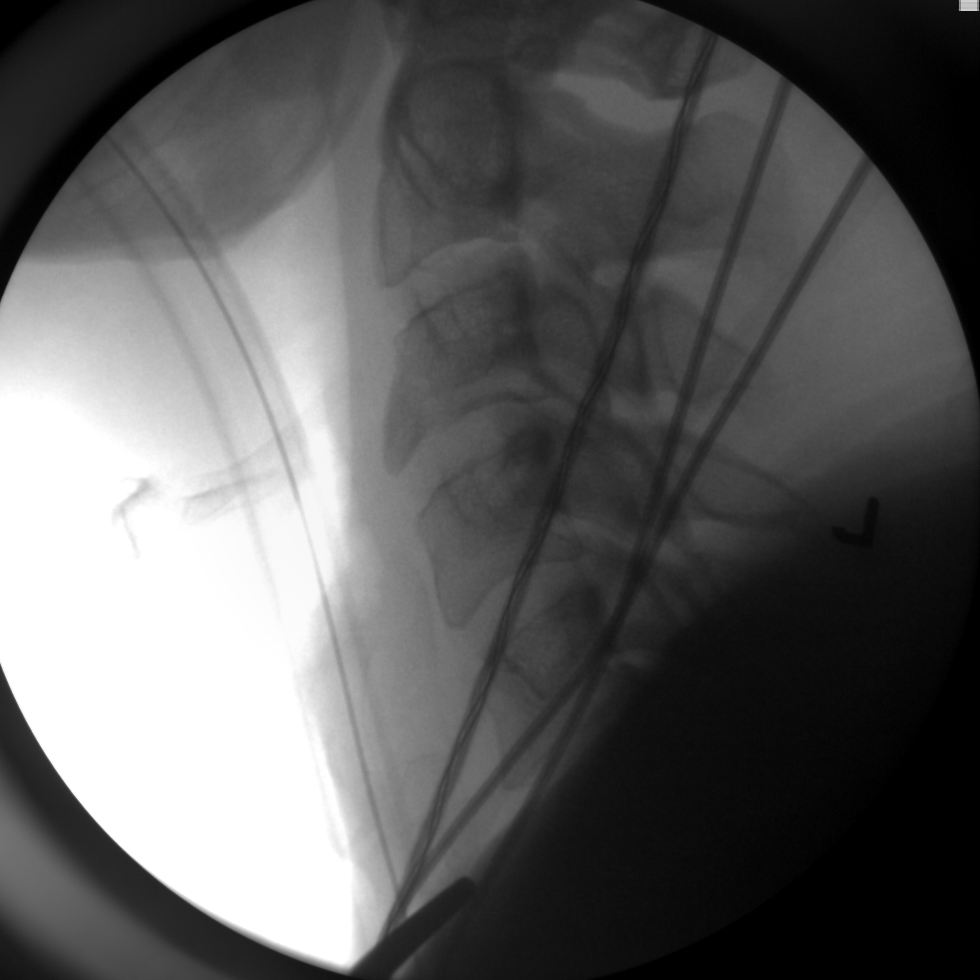
[im 2/4]
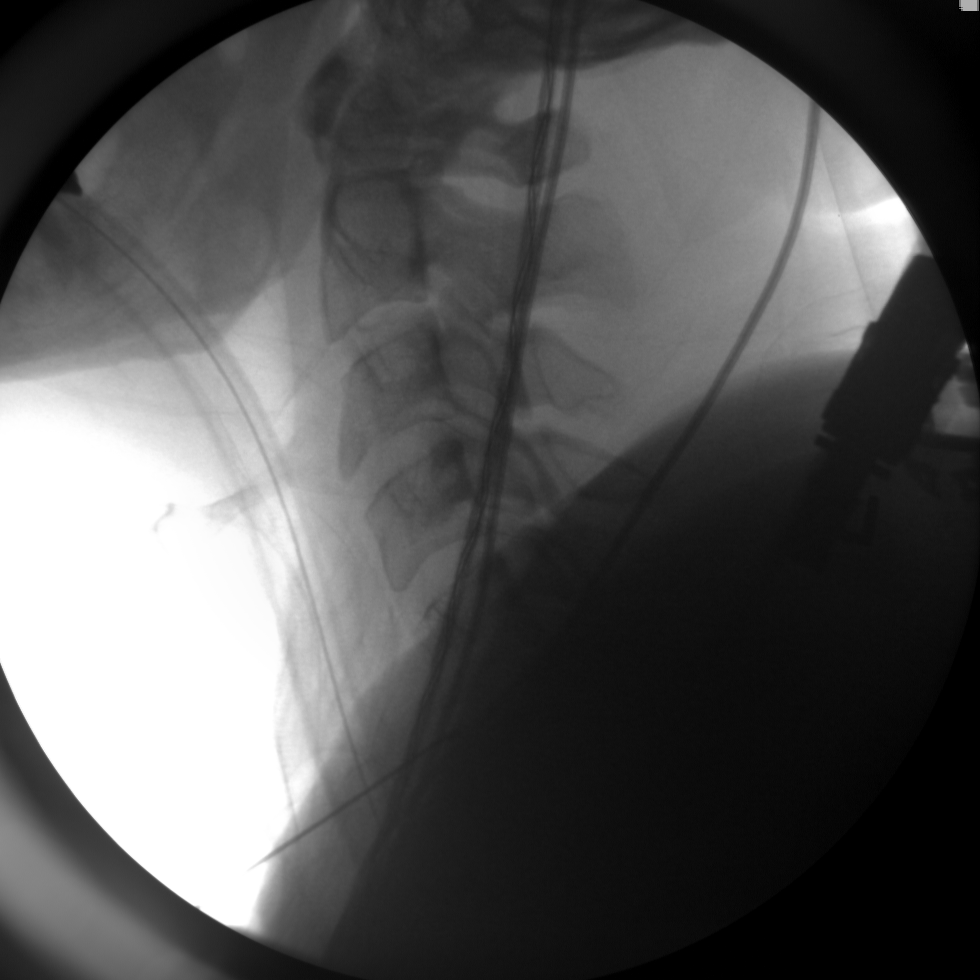
[im 3/4]
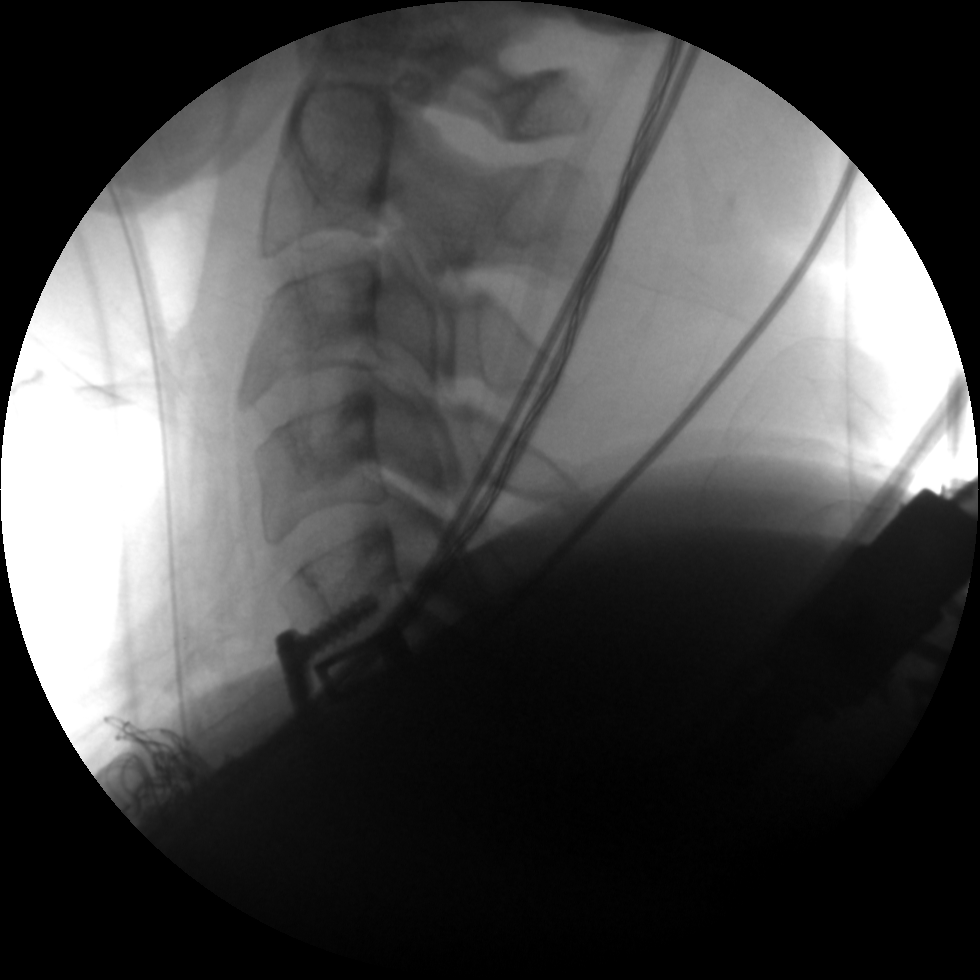
[im 4/4]
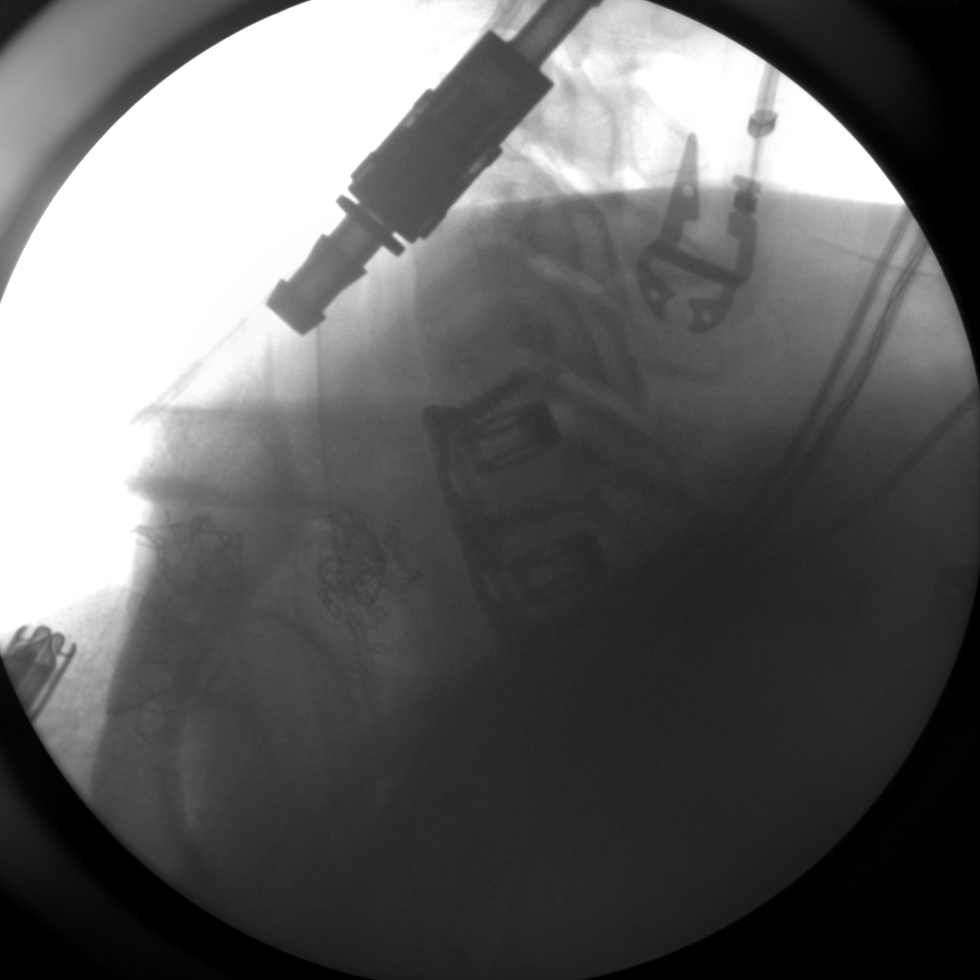

[4 of 4 positions shown; findings below may reference images not displayed]

FINDINGS: C-arm radiographs document C5-C7 ACDF with anterior plating and
interbody cage placement. Satisfactory position and alignment. On
the final film there is sponge anterior to the C6 vertebral body.
IMPRESSION: C5-7 fusion.

## 2015-11-24 ENCOUNTER — Ambulatory Visit: Payer: Self-pay | Admitting: Urology

## 2015-12-29 ENCOUNTER — Ambulatory Visit (INDEPENDENT_AMBULATORY_CARE_PROVIDER_SITE_OTHER): Admitting: Urology

## 2015-12-29 DIAGNOSIS — Z3009 Encounter for other general counseling and advice on contraception: Secondary | ICD-10-CM

## 2016-02-16 ENCOUNTER — Encounter (INDEPENDENT_AMBULATORY_CARE_PROVIDER_SITE_OTHER): Admitting: Urology

## 2016-02-16 DIAGNOSIS — Z302 Encounter for sterilization: Secondary | ICD-10-CM | POA: Diagnosis not present

## 2016-04-12 ENCOUNTER — Ambulatory Visit: Payer: Self-pay | Admitting: Urology

## 2017-10-20 DIAGNOSIS — Z23 Encounter for immunization: Secondary | ICD-10-CM | POA: Diagnosis not present

## 2017-11-08 DIAGNOSIS — Z125 Encounter for screening for malignant neoplasm of prostate: Secondary | ICD-10-CM | POA: Diagnosis not present

## 2017-11-08 DIAGNOSIS — Z0001 Encounter for general adult medical examination with abnormal findings: Secondary | ICD-10-CM | POA: Diagnosis not present

## 2017-11-09 DIAGNOSIS — Z0000 Encounter for general adult medical examination without abnormal findings: Secondary | ICD-10-CM | POA: Diagnosis not present

## 2017-11-09 DIAGNOSIS — E785 Hyperlipidemia, unspecified: Secondary | ICD-10-CM | POA: Diagnosis not present

## 2017-11-09 DIAGNOSIS — M5136 Other intervertebral disc degeneration, lumbar region: Secondary | ICD-10-CM | POA: Diagnosis not present

## 2018-07-25 DIAGNOSIS — M25562 Pain in left knee: Secondary | ICD-10-CM | POA: Diagnosis not present

## 2018-07-31 DIAGNOSIS — M25571 Pain in right ankle and joints of right foot: Secondary | ICD-10-CM | POA: Diagnosis not present

## 2018-08-05 DIAGNOSIS — M25562 Pain in left knee: Secondary | ICD-10-CM | POA: Diagnosis not present

## 2019-06-18 ENCOUNTER — Other Ambulatory Visit: Payer: Self-pay | Admitting: Internal Medicine

## 2019-06-18 ENCOUNTER — Other Ambulatory Visit (HOSPITAL_COMMUNITY): Payer: Self-pay | Admitting: Internal Medicine

## 2019-06-18 DIAGNOSIS — R519 Headache, unspecified: Secondary | ICD-10-CM

## 2019-06-25 ENCOUNTER — Ambulatory Visit (HOSPITAL_COMMUNITY): Payer: 59

## 2019-07-01 ENCOUNTER — Other Ambulatory Visit: Payer: Self-pay

## 2019-07-01 ENCOUNTER — Ambulatory Visit (HOSPITAL_COMMUNITY)
Admission: RE | Admit: 2019-07-01 | Discharge: 2019-07-01 | Disposition: A | Discharge status: Home / Self Care | Payer: 59 | Source: Ambulatory Visit | Attending: Internal Medicine | Admitting: Internal Medicine

## 2019-07-01 DIAGNOSIS — R519 Headache, unspecified: Secondary | ICD-10-CM | POA: Insufficient documentation

## 2019-11-13 ENCOUNTER — Ambulatory Visit: Payer: 59 | Attending: Internal Medicine

## 2019-11-13 DIAGNOSIS — Z23 Encounter for immunization: Secondary | ICD-10-CM

## 2019-11-13 NOTE — Progress Notes (Signed)
   Covid-19 Vaccination Clinic  Name:  Tyquez Hollibaugh    MRN: [id number] DOB: 04/07/1971  11/13/2019  Mr. Fore was observed post Covid-19 immunization for 15 minutes without incident. He was provided with Vaccine Information Sheet and instruction to access the V-Safe system.   Mr. Hargadon was instructed to call 911 with any severe reactions post vaccine: Marland Kitchen Difficulty breathing  . Swelling of face and throat  . A fast heartbeat  . A bad rash all over body  . Dizziness and weakness   Immunizations Administered    Name Date Dose VIS Date Route   Moderna COVID-19 Vaccine 11/13/2019  9:16 AM 0.5 mL 08/05/2019 Intramuscular   Manufacturer: Moderna   Lot: [id number]   NDC: 87564-332-95

## 2019-12-17 ENCOUNTER — Ambulatory Visit: Payer: 59 | Attending: Internal Medicine

## 2019-12-17 DIAGNOSIS — Z23 Encounter for immunization: Secondary | ICD-10-CM

## 2019-12-17 NOTE — Progress Notes (Signed)
   Covid-19 Vaccination Clinic  Name:  Jeffery Marshall    MRN: [id number] DOB: 1971/04/02  12/17/2019  [name] was observed post Covid-19 immunization for 15 minutes without incident. He was provided with Vaccine Information Sheet and instruction to access the V-Safe system.   [name] was instructed to call 911 with any severe reactions post vaccine: Marland Kitchen Difficulty breathing  . Swelling of face and throat  . A fast heartbeat  . A bad rash all over body  . Dizziness and weakness   Immunizations Administered    Name Date Dose VIS Date Route   Moderna COVID-19 Vaccine 12/17/2019  9:13 AM 0.5 mL 08/05/2019 Intramuscular   Manufacturer: Gala Murdoch   Lot: [id number]   NDC: 80777-273-99      Covid-19 Vaccination Clinic  Name:  Jeffery Marshall    MRN: [id number] DOB: 1971/07/01  12/17/2019  [name] was observed post Covid-19 immunization for 15 minutes without incident. He was provided with Vaccine Information Sheet and instruction to access the V-Safe system.   [name] was instructed to call 911 with any severe reactions post vaccine: Marland Kitchen Difficulty breathing  . Swelling of face and throat  . A fast heartbeat  . A bad rash all over body  . Dizziness and weakness   Immunizations Administered    Name Date Dose VIS Date Route   Moderna COVID-19 Vaccine 12/17/2019  9:13 AM 0.5 mL 08/05/2019 Intramuscular   Manufacturer: Moderna   Lot: [id number]   NDC: 81829-937-16

## 2020-06-03 ENCOUNTER — Ambulatory Visit: Payer: Self-pay

## 2020-10-08 ENCOUNTER — Ambulatory Visit: Payer: 59 | Admitting: Urology

## 2021-04-26 ENCOUNTER — Ambulatory Visit: Payer: No Typology Code available for payment source | Attending: Nurse Practitioner | Admitting: Nurse Practitioner

## 2021-04-26 ENCOUNTER — Ambulatory Visit: Payer: No Typology Code available for payment source

## 2021-04-26 ENCOUNTER — Other Ambulatory Visit
Admission: RE | Admit: 2021-04-26 | Discharge: 2021-04-26 | Disposition: A | Discharge status: Home / Self Care | Payer: No Typology Code available for payment source | Source: Ambulatory Visit | Attending: Nurse Practitioner | Admitting: Nurse Practitioner

## 2021-04-26 ENCOUNTER — Encounter (RURAL_HEALTH_CENTER): Payer: Self-pay | Admitting: Nurse Practitioner

## 2021-04-26 VITALS — Resp 18 | Wt 200.0 lb

## 2021-04-26 DIAGNOSIS — Z20822 Contact with and (suspected) exposure to covid-19: Secondary | ICD-10-CM

## 2021-04-26 DIAGNOSIS — Z1152 Encounter for screening for COVID-19: Secondary | ICD-10-CM

## 2021-04-26 DIAGNOSIS — H109 Unspecified conjunctivitis: Secondary | ICD-10-CM

## 2021-04-26 MED ORDER — POLYMYXIN B-TRIMETHOPRIM 10000-0.1 UNIT/ML-% OP SOLN
2.0000 [drp] | OPHTHALMIC | 0 refills | Status: AC
Start: 2021-04-26 — End: ?

## 2021-04-26 NOTE — Progress Notes (Incomplete)
PROGRESS NOTE      Patient Name: Gary Roth  Primary Care Physician: York Cerise, FNP      History of Presenting Illness:   Gary Roth is a 50 y.o. male who presents to the office with   Chief Complaint   Patient presents with    Covid-19 Screening     Exposure to Covid. Daughter is positive on 08/22.       Nasal Congestion    Sore Throat    Conjunctivitis     Right eye      Cough     No fever       Past Medical History:   History reviewed. No pertinent past medical history.    Past Surgical History:   History reviewed. No pertinent surgical history.    Family History:   History reviewed. No pertinent family history.    Social History:     Social History     Tobacco Use   Smoking Status Never   Smokeless Tobacco Never     Social History     Substance and Sexual Activity   Alcohol Use Never     Social History     Substance and Sexual Activity   Drug Use Never       Allergies:   Not on File    Medications:     Prior to Admission medications    Medication Sig Start Date End Date Taking? Authorizing Provider   cyclobenzaprine (FLEXERIL) 5 MG tablet TAKE 1 TABLET BY MOUTH EVERY 12 HOURS AS NEEDED FOR MUSCLE SPASMS. MAY CAUSE DROWSINES 01/22/21   [provider]   traMADol Janean Sark) 50 MG tablet  04/25/21   [provider]   zolpidem (AMBIEN) 10 MG tablet TAKE 1/2 TO 1 TABLET BY MOUTH EVERY NIGHT AS NEEDED 03/29/21   [provider]       Review of Systems:      See HPI.    Physical Exam:     Vitals:    04/26/21 1013   Resp: 18     There is no height or weight on file to calculate BMI.    General:  no acute distress. Pleasant and well groomed.  Neck: no JVD, no tracheal deviation  Lungs: Effort normal, no use accessory muscles.  Neuro:  alert and oriented.   Skin: no rashes or lesions noted  Psychiatric/Behavioral: appropriate affect, mood and behavior.     No results found for this or any previous visit (from the past 336 hour(s)).       Assessment:   No diagnosis found.    Plan:   There  are no Patient Instructions on file for this visit.     Requested Prescriptions      No prescriptions requested or ordered in this encounter      This visit was conducted with the use of interactive audio/video telecommunications that permitted real-time communication between [employer] and myself. Gary Roth consented to practice patient and received services at home, while I was located at [facility]. [facility].   Patient was counseled on possible medicine side effects which may include rash, swelling and/or stomach upset.  Patient was instructed to notify me or the ER if they experience problems.    No orders of the defined types were placed in this encounter.       Before leaving the office, the patient was informed of all ordered diagnostic tests and consults.     If you have not heard  back from [place] about test results in 14 days, please call the office at [phone].    Signed by: York Cerise, FNP, NP    Supervising Doctor: Rogelio Seen, MD    The patient's electronic medical record was reviewed, any changes in the past medical history, past surgical history, medications, diagnostic tests were noted, and the record was updated accordingly.     Discussed option available for my chart which provides electronic access to diagnostic results.

## 2021-04-27 LAB — VH SARS-COV-2 ASSAY (PERKINELMER SYSTEM(TM))
Does patient reside in a congregate care setting?: NEGATIVE
Is patient employed in a healthcare setting?: NEGATIVE
Is the patient pregnant?: NEGATIVE
SARS-CoV-2 Assay (PerkinElmer System (TM)): DETECTED — CR

## 2021-05-01 NOTE — Patient Instructions (Addendum)
Exposed to COVID 19.  Will order a COVID lab test. Will call with the results when available.  Conjunctivitis. Right eye.  Polytrim as ordered.  Follow up as needed if symptoms worsen or do not improve.

## 2021-05-24 ENCOUNTER — Ambulatory Visit: Payer: No Typology Code available for payment source | Attending: Nurse Practitioner | Admitting: Nurse Practitioner

## 2021-05-24 ENCOUNTER — Telehealth (RURAL_HEALTH_CENTER): Payer: Self-pay | Admitting: Nurse Practitioner

## 2021-05-24 ENCOUNTER — Encounter (RURAL_HEALTH_CENTER): Payer: Self-pay | Admitting: Nurse Practitioner

## 2021-05-24 ENCOUNTER — Other Ambulatory Visit
Admission: RE | Admit: 2021-05-24 | Discharge: 2021-05-24 | Disposition: A | Discharge status: Home / Self Care | Payer: No Typology Code available for payment source | Source: Ambulatory Visit | Attending: Nurse Practitioner | Admitting: Nurse Practitioner

## 2021-05-24 VITALS — BP 110/70 | HR 63 | Temp 95.7°F | Resp 20 | Ht 73.0 in | Wt 198.8 lb

## 2021-05-24 DIAGNOSIS — Z125 Encounter for screening for malignant neoplasm of prostate: Secondary | ICD-10-CM

## 2021-05-24 DIAGNOSIS — E559 Vitamin D deficiency, unspecified: Secondary | ICD-10-CM

## 2021-05-24 DIAGNOSIS — Z1211 Encounter for screening for malignant neoplasm of colon: Secondary | ICD-10-CM

## 2021-05-24 DIAGNOSIS — R5383 Other fatigue: Secondary | ICD-10-CM

## 2021-05-24 LAB — COMPREHENSIVE METABOLIC PANEL
ALT: 22 U/L (ref 0–55)
AST (SGOT): 27 U/L (ref 10–42)
Albumin/Globulin Ratio: 1.54 Ratio (ref 0.80–2.00)
Albumin: 4.3 gm/dL (ref 3.5–5.0)
Alkaline Phosphatase: 62 U/L (ref 40–145)
Anion Gap: 12.8 mMol/L (ref 7.0–18.0)
BUN / Creatinine Ratio: 18 Ratio (ref 10.0–30.0)
BUN: 16 mg/dL (ref 7–22)
Bilirubin, Total: 0.8 mg/dL (ref 0.1–1.2)
CO2: 27 mMol/L (ref 20–30)
Calcium: 9.6 mg/dL (ref 8.5–10.5)
Chloride: 106 mMol/L (ref 98–110)
Creatinine: 0.89 mg/dL (ref 0.80–1.30)
EGFR: 104 mL/min/{1.73_m2} (ref 60–150)
Globulin: 2.8 gm/dL (ref 2.0–4.0)
Glucose: 81 mg/dL (ref 71–99)
Osmolality Calculated: 281 mOsm/kg (ref 275–300)
Potassium: 4.8 mMol/L (ref 3.5–5.3)
Protein, Total: 7.1 gm/dL (ref 6.0–8.3)
Sodium: 141 mMol/L (ref 136–147)

## 2021-05-24 LAB — CBC AND DIFFERENTIAL
Basophils %: 0.3 % (ref 0.0–3.0)
Basophils Absolute: 0 10*3/uL (ref 0.0–0.3)
Eosinophils %: 3.1 % (ref 0.0–7.0)
Eosinophils Absolute: 0.1 10*3/uL (ref 0.0–0.8)
Hematocrit: 42.9 % (ref 39.0–52.5)
Hemoglobin: 14.1 gm/dL (ref 13.0–17.5)
Lymphocytes Absolute: 1.3 10*3/uL (ref 0.6–5.1)
Lymphocytes: 34.6 % (ref 15.0–46.0)
MCH: 31 pg (ref 28–35)
MCHC: 33 gm/dL (ref 32–36)
MCV: 95 fL (ref 80–100)
MPV: 8.1 fL (ref 6.0–10.0)
Monocytes Absolute: 0.2 10*3/uL (ref 0.1–1.7)
Monocytes: 6.2 % (ref 3.0–15.0)
Neutrophils %: 55.9 % (ref 42.0–78.0)
Neutrophils Absolute: 2.2 10*3/uL (ref 1.7–8.6)
PLT CT: 185 10*3/uL (ref 130–440)
RBC: 4.53 10*6/uL (ref 4.00–5.70)
RDW: 12.1 % (ref 11.0–14.0)
WBC: 3.8 10*3/uL — ABNORMAL LOW (ref 4.0–11.0)

## 2021-05-24 LAB — LIPID PANEL
Cholesterol: 236 mg/dL — ABNORMAL HIGH (ref 75–199)
Coronary Heart Disease Risk: 3.93
HDL: 60 mg/dL — ABNORMAL HIGH (ref 40–55)
LDL Calculated: 161 mg/dL
Triglycerides: 74 mg/dL (ref 10–150)
VLDL: 15 (ref 0–40)

## 2021-05-24 LAB — PROSTATE SPECIFIC ANTIGEN SCREEN: PSA: 0.772 ng/mL (ref 0.000–4.000)

## 2021-05-24 LAB — IRON PROFILE
% Saturation: 22 % (ref 15–50)
Iron: 77 ug/dL (ref 50.0–175.0)
TIBC: 346 ug/dL (ref 250–450)
Transferrin: 247 mg/dL (ref 174.0–364.0)

## 2021-05-24 LAB — VITAMIN B12: Vitamin B-12: 478 pg/mL (ref 213–816)

## 2021-05-24 NOTE — Telephone Encounter (Signed)
Patient just checked out with me in the front. He wanted to know if there is anyway [facility]ie would be willing to do a referral for him for lower back pain. He said he had been to a specialists before for this issue. Rather it be PT or somewhere else. He would like to have this done.    Thank you

## 2021-05-24 NOTE — Patient Instructions (Addendum)
Physical Exam  Increased fatigue over the past 6-8 months  Increase water intake  Lab work today, will follow up with results  Cologuard ordered instead of colonoscopy. Will receive information in mail.

## 2021-05-24 NOTE — Progress Notes (Signed)
PROGRESS NOTE      Patient Name: Gary Roth  Primary Care Physician: York Cerise, FNP      History of Presenting Illness:   Gary Roth is a 50 y.o. male who presents to the office with physical exam.  Chief Complaint   Patient presents with    Annual Exam     Patient is being seen for annual physical exam. Lab work is due today and is requesting to have everything done. Patient is due for colonoscopy. No history of colon cancer in family. Cologuard discussed and agreed upon. Patient stating he has had increased fatigue the last 6-8 moths. Patient denies fever, chills, lymphadenopathy with fatigue. [place] and has been bitten by multiple ticks. Lyme disease and possible mononucleosis discussed with patient. Patient states he had COVID approximately 3 weeks ago. Patient has chronic neck and back pain, which he follows up with a chiropractor for adjustments, acupuncture, and exercises. Patient has seen neurosurgeon in past due to neck surgery. 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    Past Medical History:   No past medical history on file.    Past Surgical History:   No past surgical history on file.    Family History:   No family history on file.    Social History:     Social History     Tobacco Use   Smoking Status Never   Smokeless Tobacco Never     Social History     Substance and Sexual Activity   Alcohol Use Never     Social History     Substance and Sexual Activity   Drug Use Never       Allergies:   No Known Allergies    Medications:     Prior to Admission medications    Medication Sig Start Date End Date Taking? Authorizing Provider   cyclobenzaprine (FLEXERIL) 5 MG tablet TAKE 1 TABLET BY MOUTH EVERY 12 HOURS AS NEEDED FOR MUSCLE SPASMS. MAY CAUSE DROWSINES 01/22/21  Yes [provider]   traMADol (ULTRAM) 50 MG tablet  04/25/21  Yes [provider]   trimethoprim-polymyxin b  (POLYTRIM) ophthalmic solution Place 2 drops into the right eye every 4 (four) hours 04/26/21  Yes Barry Dienes, Jacquetta Polhamus C, FNP   zolpidem (AMBIEN) 10 MG tablet TAKE 1/2 TO 1 TABLET BY MOUTH EVERY NIGHT AS NEEDED 03/29/21  Yes [provider]       Review of Systems:      Constitutional: Negative for fever or weight changes. Positive for increased fatigue for 6-8 months.  HENT: Negative for ear pain, congestion, rhinorrhea.    Eyes: Negative for discharge, redness and itching.   Respiratory: Negative for cough and shortness of breath.    Cardiovascular: Negative for chest pain, palpitations and leg swelling.   Gastrointestinal: Negative for nausea, vomiting, heartburn, abdominal pain, changes in bowel habits.  Genitourinary: Negative for dysuria, urgency and difficulty urinating. Positive for increased frequency as he has gotten older.  Endocrine: Negative for polyuria, polydipsia, polyphagia, or heat/cold intolerances.  Musculoskeletal: Positive for chronic neck and back pain due to [employer].  Neurological: Negative for dizziness, weakness. Positive for tension headaches due to neck pain.  Skin: Negative for rashes or lesions.  Psychiatric/Behavioral: Negative.      Physical Exam:     Vitals:    05/24/21 0811   BP: 110/70  Pulse: 63   Resp: 20   Temp: (!) 95.7 F (35.4 C)   SpO2: 98%     Body mass index is 26.23 kg/m.    General:  no acute distress. Pleasant and well groomed.  HEENT: eomi, sclera anicteric, oropharynx clear without lesions, mucous membranes moist  Neck: supple, no lymphadenopathy, no thyromegaly  Cardiovascular: regular rate and rhythm, no murmurs  Lungs: clear to auscultation bilaterally, without wheezing, rhonchi, or rales  Abdomen: soft, non-tender, non-distended  Extremities: no edema  Musculoskeletal: Gait WNL. No swelling of joints. Slight decreased range of motion in neck.   Neuro:  alert and oriented.   Skin: no rashes or lesions noted  Psychiatric/Behavioral: appropriate  affect, mood and behavior.    No results found for this or any previous visit (from the past 336 hour(s)).       Assessment:     1. Fatigue, unspecified type    2. Vitamin D deficiency    3. Screening PSA (prostate specific antigen)    4. Colon cancer screening        Plan:     Patient Instructions   Physical Exam  Increased fatigue over the past 6-8 months  Increase water intake  Lab work today, will follow up with results  Cologuard ordered instead of colonoscopy. Will receive information in mail.        Requested Prescriptions      No prescriptions requested or ordered in this encounter        Patient was counseled on possible medicine side effects which may include rash, swelling and/or stomach upset.  Patient was instructed to notify me or the ER if they experience problems.    Orders Placed This Encounter   Procedures    CBC and differential     Standing Status:   Future     Standing Expiration Date:   05/24/2022     Order Specific Question:   Release to patient     Answer:   Immediate    Comprehensive metabolic panel     Standing Status:   Future     Standing Expiration Date:   05/24/2022     Order Specific Question:   Has the patient fasted?     Answer:   Yes     Order Specific Question:   Release to patient     Answer:   Immediate    Vitamin B12     Standing Status:   Future     Standing Expiration Date:   05/24/2022     Order Specific Question:   Release to patient     Answer:   Immediate    IRON PROFILE     Standing Status:   Future     Standing Expiration Date:   05/24/2022     Order Specific Question:   Release to patient     Answer:   Immediate    Lipid panel     Standing Status:   Future     Standing Expiration Date:   05/24/2022     Order Specific Question:   Has the patient fasted?     Answer:   Yes     Order Specific Question:   Release to patient     Answer:   Immediate    Testosterone Free and Total     Standing Status:   Future     Standing Expiration Date:   05/24/2022     Order Specific Question:    Release  to patient     Answer:   Immediate    Lyme Ab Tot Rflx to WB IGG/IGM     Standing Status:   Future     Standing Expiration Date:   05/24/2022     Order Specific Question:   Release to patient     Answer:   Immediate    Vitamin D,25 OH, Total     Standing Status:   Future     Standing Expiration Date:   05/24/2022     Order Specific Question:   Release to patient     Answer:   Immediate    PROSTATE SPECIFIC ANTIGEN SCREEN     Standing Status:   Future     Standing Expiration Date:   05/24/2022     Order Specific Question:   Release to patient     Answer:   Immediate    [facility] Cologuard     Scheduling Instructions:      COLOGUARD ORDER REQUISITION FORM                                                                    [facility]            [address], [place], [place] [address]       P: [phone]  [email or web address]            Phone Number: [phone] (home)       Email Address: No e-mail address on record              Primary ICD-10 Code:      Screening for colon cancer   Z12.11      Screening for rectal cancer   Z12.12      The above ICD-10 code is listed as a convenience. Ordering practitioners should report the diagnosis code(s) that best describes the reason for performing the test, regardless of whether the code is listed above or not.                 Secure Fax completed form to [phone]      Order Specific Question:   Specimen type/source/volume     Answer:   stool     Order Specific Question:   Release to patient     Answer:   Immediate    Epstein Barr Virus (EBV) Antibody Panel     Standing Status:   Future     Standing Expiration Date:   05/24/2022     Order Specific Question:   Release to patient     Answer:   Immediate          Before leaving the office, the patient was informed of all ordered diagnostic tests and consults.     If you have not heard back from [place] about test results in 14 days, please call the office at [phone].    Signed by: York Cerise, FNP,  NP    Supervising Doctor: Rogelio Seen, MD    The patient's electronic medical record was reviewed, any changes in the past medical history, past surgical history, medications, diagnostic tests were noted, and the record was updated accordingly.     Discussed option available for my chart which provides  electronic access to diagnostic results.       I have reviewed the above plan with nurse practitioner student, Ledell Peoples. I agree with the assessment. I had a face to face encounter with the patient and e-prescribed Rx provided.    Genia Harold, NP-C

## 2021-05-25 LAB — LYME AB, TOTAL,REFLEX TO WESTERN BLOT (IGG & IGM): Lyme AB: NONREACTIVE

## 2021-05-25 LAB — VITAMIN D,25 OH,TOTAL: Vitamin D 25-Hydroxy: 36 ng/mL (ref 30–80)

## 2021-05-27 LAB — VH EPSTEIN BARR VIRUS (EBV) ANTIBODY PANEL
EBV EBNA IgG: POSITIVE RLU — AB
EBV VCA IgG: POSITIVE RLU — AB
EBV VCA IgM: NEGATIVE RLU

## 2021-05-27 LAB — TESTOSTERONE, FREE, TOTAL
Testosterone Free: 117.8 pg/mL (ref 35.0–155.0)
Testosterone Total MS: 736 ng/dL (ref 250–1100)

## 2021-06-03 ENCOUNTER — Telehealth (RURAL_HEALTH_CENTER): Payer: Self-pay

## 2021-06-03 ENCOUNTER — Encounter (RURAL_HEALTH_CENTER): Payer: Self-pay

## 2021-06-03 NOTE — Telephone Encounter (Signed)
At pt's last appt I was reviewing his care gaps with him and he stated he does not have a family history of colon polyps and would like to do the cologuard test instead of a colonoscopy. Will you order the cologuard test for him please.

## 2021-06-17 ENCOUNTER — Encounter (RURAL_HEALTH_CENTER): Payer: Self-pay | Admitting: Nurse Practitioner

## 2021-06-20 ENCOUNTER — Other Ambulatory Visit (RURAL_HEALTH_CENTER): Payer: Self-pay

## 2021-06-21 ENCOUNTER — Other Ambulatory Visit (RURAL_HEALTH_CENTER): Payer: Self-pay | Admitting: Nurse Practitioner

## 2021-06-21 MED ORDER — ZOLPIDEM TARTRATE 10 MG PO TABS
ORAL_TABLET | ORAL | 1 refills | Status: AC
Start: 2021-06-21 — End: ?

## 2021-06-21 MED ORDER — TRAMADOL HCL 50 MG PO TABS
50.0000 mg | ORAL_TABLET | Freq: Three times a day (TID) | ORAL | 1 refills | Status: AC | PRN
Start: 2021-06-21 — End: ?

## 2021-06-23 ENCOUNTER — Other Ambulatory Visit (RURAL_HEALTH_CENTER): Payer: Self-pay | Admitting: Nurse Practitioner

## 2021-06-23 DIAGNOSIS — G8929 Other chronic pain: Secondary | ICD-10-CM

## 2021-06-23 DIAGNOSIS — Z1211 Encounter for screening for malignant neoplasm of colon: Secondary | ICD-10-CM

## 2021-06-27 ENCOUNTER — Ambulatory Visit (RURAL_HEALTH_CENTER): Payer: No Typology Code available for payment source | Admitting: Nurse Practitioner
